# Patient Record
Sex: Female | Born: 1965 | Race: White | Hispanic: No | Smoking: Current every day smoker
Health system: Southern US, Community
[De-identification: ages and names within clinical notes are randomized; demographics above are authoritative.]

## PROBLEM LIST (undated history)

## (undated) DIAGNOSIS — C801 Malignant (primary) neoplasm, unspecified: Secondary | ICD-10-CM

## (undated) DIAGNOSIS — B1920 Unspecified viral hepatitis C without hepatic coma: Secondary | ICD-10-CM

## (undated) HISTORY — PX: APPENDECTOMY: SHX54

## (undated) HISTORY — PX: ABDOMINAL HYSTERECTOMY: SHX81

---

## 2005-02-27 ENCOUNTER — Emergency Department (HOSPITAL_COMMUNITY): Admission: EM | Admit: 2005-02-27 | Discharge: 2005-02-27 | Payer: Self-pay | Admitting: Emergency Medicine

## 2005-03-14 ENCOUNTER — Other Ambulatory Visit: Admission: RE | Admit: 2005-03-14 | Discharge: 2005-03-14 | Payer: Self-pay | Admitting: Obstetrics and Gynecology

## 2005-06-11 ENCOUNTER — Observation Stay (HOSPITAL_COMMUNITY): Admission: RE | Admit: 2005-06-11 | Discharge: 2005-06-12 | Payer: Self-pay | Admitting: Obstetrics and Gynecology

## 2015-02-06 ENCOUNTER — Ambulatory Visit (INDEPENDENT_AMBULATORY_CARE_PROVIDER_SITE_OTHER): Payer: No Typology Code available for payment source

## 2015-02-06 ENCOUNTER — Ambulatory Visit (INDEPENDENT_AMBULATORY_CARE_PROVIDER_SITE_OTHER): Payer: No Typology Code available for payment source | Admitting: Emergency Medicine

## 2015-02-06 VITALS — BP 124/72 | HR 84 | Temp 98.0°F | Resp 18 | Ht 64.0 in | Wt 187.0 lb

## 2015-02-06 DIAGNOSIS — S6710XA Crushing injury of unspecified finger(s), initial encounter: Secondary | ICD-10-CM

## 2015-02-06 DIAGNOSIS — S63619A Unspecified sprain of unspecified finger, initial encounter: Secondary | ICD-10-CM | POA: Diagnosis not present

## 2015-02-06 NOTE — Progress Notes (Addendum)
Subjective:  Patient ID: Cheryl Guzman, female    DOB: 1965-12-18  Age: 49 y.o. MRN: [id number]  CC: Hand Pain   HPI Cheryl Guzman presents  with an injury to right index finger. She said she was moving furniture in [place] fell on her finger and it trapped between the patient's body and the furniture one week ago. She has pain in the metacarpophalangeal joint and some swelling. She has no deformity. She does a lot of typing at work and is concerned about the injury. She has no improvement with over-the-counter medication.  History [name] has no past medical history on file.   She has past surgical history that includes Abdominal hysterectomy.   Her  family history includes Cancer in her mother; Diabetes in her father and mother; Heart disease in her mother; Hyperlipidemia in her mother; Stroke in her father and mother.  She   reports that she has been smoking.  She does not have any smokeless tobacco history on file. Her alcohol and drug histories are not on file.  No outpatient prescriptions prior to visit.   No facility-administered medications prior to visit.    History   Social History  . Marital Status: Divorced    Spouse Name: [name]  . Number of Children: [name]  . Years of Education: [name]   Social History Main Topics  . Smoking status: Current Every Day Smoker  . Smokeless tobacco: Not on file  . Alcohol Use: Not on file  . Drug Use: Not on file  . Sexual Activity: Not on file   Other Topics Concern  . None   Social History Narrative  . None     Review of Systems  Constitutional: Negative for fever, chills and appetite change.  HENT: Negative for congestion, ear pain, postnasal drip, sinus pressure and sore throat.   Eyes: Negative for pain and redness.  Respiratory: Negative for cough, shortness of breath and wheezing.   Cardiovascular: Negative for leg swelling.  Gastrointestinal: Negative for nausea, vomiting, abdominal pain, diarrhea,  constipation and blood in stool.  Endocrine: Negative for polyuria.  Genitourinary: Negative for dysuria, urgency, frequency and flank pain.  Musculoskeletal: Negative for gait problem.  Skin: Negative for rash.  Neurological: Negative for weakness and headaches.  Psychiatric/Behavioral: Negative for confusion and decreased concentration. The patient is not nervous/anxious.     Objective:  BP 124/72 mmHg  Pulse 84  Temp(Src) 98 F (36.7 C)  Resp 18  Ht 5\' 4"  (1.626 m)  Wt 187 lb (84.823 kg)  BMI 32.08 kg/m2  SpO2 97%  Physical Exam  Constitutional: She is oriented to person, place, and time. She appears well-developed and well-nourished. No distress.  HENT:  Head: Normocephalic and atraumatic.  Right Ear: External ear normal.  Left Ear: External ear normal.  Nose: Nose normal.  Eyes: Conjunctivae and EOM are normal. Pupils are equal, round, and reactive to light. No scleral icterus.  Neck: Normal range of motion. Neck supple. No tracheal deviation present.  Cardiovascular: Normal rate, regular rhythm and normal heart sounds.   Pulmonary/Chest: Effort normal. No respiratory distress. She has no wheezes. She has no rales.  Abdominal: She exhibits no mass. There is no tenderness. There is no rebound and no guarding.  Musculoskeletal: She exhibits no edema.  Lymphadenopathy:    She has no cervical adenopathy.  Neurological: She is alert and oriented to person, place, and time. Coordination normal.  Skin: Skin is warm and dry. No rash noted.  Psychiatric: She has  a normal mood and affect. Her behavior is normal.      Assessment & Plan:   Andelyn was seen today for hand pain.  Diagnoses and all orders for this visit:  Sprain, finger, initial encounter  Crushed finger, initial encounter Orders: -     DG Finger Index Right; Future   Her finger was buddy taped to the third finger and she'll follow-up as needed taking over-the-counter Aleve.  [name] does not  currently have medications on file.  No orders of the defined types were placed in this encounter.    Appropriate red flag conditions were discussed with the patient as well as actions that should be taken.  Patient expressed his understanding.  Follow-up: Return if symptoms worsen or fail to improve.  [name], MD    [facility] reading (PRIMARY) by  Dr. Ouida Sills. negative.

## 2015-02-06 NOTE — Patient Instructions (Signed)
Finger Sprain  A finger sprain is a tear in one of the strong, fibrous tissues that connect the bones (ligaments) in your finger. The severity of the sprain depends on how much of the ligament is torn. The tear can be either partial or complete.  CAUSES   Often, sprains are a result of a fall or accident. If you extend your hands to catch an object or to protect yourself, the force of the impact causes the fibers of your ligament to stretch too much. This excess tension causes the fibers of your ligament to tear.  SYMPTOMS   You may have some loss of motion in your finger. Other symptoms include:   Bruising.   Tenderness.   Swelling.  DIAGNOSIS   In order to diagnose finger sprain, your caregiver will physically examine your finger or thumb to determine how torn the ligament is. Your caregiver may also suggest an X-ray exam of your finger to make sure no bones are broken.  TREATMENT   If your ligament is only partially torn, treatment usually involves keeping the finger in a fixed position (immobilization) for a short period. To do this, your caregiver will apply a bandage, cast, or splint to keep your finger from moving until it heals. For a partially torn ligament, the healing process usually takes 2 to 3 weeks.  If your ligament is completely torn, you may need surgery to reconnect the ligament to the bone. After surgery a cast or splint will be applied and will need to stay on your finger or thumb for 4 to 6 weeks while your ligament heals.  HOME CARE INSTRUCTIONS   Keep your injured finger elevated, when possible, to decrease swelling.   To ease pain and swelling, apply ice to your joint twice a day, for 2 to 3 days:   Put ice in a plastic bag.   Place a towel between your skin and the bag.   Leave the ice on for 15 minutes.   Only take over-the-counter or prescription medicine for pain as directed by your caregiver.   Do not wear rings on your injured finger.   Do not leave your finger unprotected  until pain and stiffness go away (usually 3 to 4 weeks).   Do not allow your cast or splint to get wet. Cover your cast or splint with a plastic bag when you shower or bathe. Do not swim.   Your caregiver may suggest special exercises for you to do during your recovery to prevent or limit permanent stiffness.  SEEK IMMEDIATE MEDICAL CARE IF:   Your cast or splint becomes damaged.   Your pain becomes worse rather than better.  MAKE SURE YOU:   Understand these instructions.   Will watch your condition.   Will get help right away if you are not doing well or get worse.  Document Released: 09/05/2004 Document Revised: 10/21/2011 Document Reviewed: 04/01/2011  [employer] Patient Information 2015 [employer]. This information is not intended to replace advice given to you by your health care provider. Make sure you discuss any questions you have with your health care provider.

## 2016-03-10 ENCOUNTER — Encounter (HOSPITAL_COMMUNITY): Payer: Self-pay

## 2016-03-10 ENCOUNTER — Emergency Department (HOSPITAL_COMMUNITY): Payer: Self-pay

## 2016-03-10 ENCOUNTER — Inpatient Hospital Stay (HOSPITAL_COMMUNITY)
Admission: EM | Admit: 2016-03-10 | Discharge: 2016-03-13 | DRG: 419 | Disposition: A | Discharge status: Home / Self Care | Payer: Self-pay | Attending: General Surgery | Admitting: General Surgery

## 2016-03-10 DIAGNOSIS — D72829 Elevated white blood cell count, unspecified: Secondary | ICD-10-CM

## 2016-03-10 DIAGNOSIS — Z833 Family history of diabetes mellitus: Secondary | ICD-10-CM

## 2016-03-10 DIAGNOSIS — K810 Acute cholecystitis: Principal | ICD-10-CM | POA: Diagnosis present

## 2016-03-10 DIAGNOSIS — B1920 Unspecified viral hepatitis C without hepatic coma: Secondary | ICD-10-CM | POA: Diagnosis present

## 2016-03-10 DIAGNOSIS — K37 Unspecified appendicitis: Secondary | ICD-10-CM | POA: Diagnosis present

## 2016-03-10 DIAGNOSIS — R112 Nausea with vomiting, unspecified: Secondary | ICD-10-CM

## 2016-03-10 DIAGNOSIS — Z823 Family history of stroke: Secondary | ICD-10-CM

## 2016-03-10 DIAGNOSIS — Z809 Family history of malignant neoplasm, unspecified: Secondary | ICD-10-CM

## 2016-03-10 DIAGNOSIS — K5900 Constipation, unspecified: Secondary | ICD-10-CM | POA: Diagnosis present

## 2016-03-10 DIAGNOSIS — Z8249 Family history of ischemic heart disease and other diseases of the circulatory system: Secondary | ICD-10-CM

## 2016-03-10 DIAGNOSIS — R1011 Right upper quadrant pain: Secondary | ICD-10-CM

## 2016-03-10 DIAGNOSIS — F17200 Nicotine dependence, unspecified, uncomplicated: Secondary | ICD-10-CM | POA: Diagnosis present

## 2016-03-10 HISTORY — DX: Unspecified viral hepatitis C without hepatic coma: B19.20

## 2016-03-10 LAB — URINALYSIS, ROUTINE W REFLEX MICROSCOPIC
GLUCOSE, UA: NEGATIVE mg/dL
Hgb urine dipstick: NEGATIVE
Ketones, ur: 40 mg/dL — AB
LEUKOCYTES UA: NEGATIVE
Nitrite: NEGATIVE
PH: 5.5 (ref 5.0–8.0)
PROTEIN: 100 mg/dL — AB

## 2016-03-10 LAB — DIFFERENTIAL
BASOS PCT: 0 %
Basophils Absolute: 0 10*3/uL (ref 0.0–0.1)
EOS ABS: 0 10*3/uL (ref 0.0–0.7)
Eosinophils Relative: 0 %
Lymphocytes Relative: 18 %
Lymphs Abs: 2.4 10*3/uL (ref 0.7–4.0)
MONO ABS: 0.7 10*3/uL (ref 0.1–1.0)
Monocytes Relative: 5 %
NEUTROS ABS: 10 10*3/uL — AB (ref 1.7–7.7)
Neutrophils Relative %: 77 %

## 2016-03-10 LAB — CBC
HEMATOCRIT: 42.8 % (ref 36.0–46.0)
HEMOGLOBIN: 13.7 g/dL (ref 12.0–15.0)
MCH: 28.8 pg (ref 26.0–34.0)
MCHC: 32 g/dL (ref 30.0–36.0)
MCV: 90.1 fL (ref 78.0–100.0)
PLATELETS: 323 10*3/uL (ref 150–400)
RBC: 4.75 MIL/uL (ref 3.87–5.11)
RDW: 13.1 % (ref 11.5–15.5)
WBC: 12.8 10*3/uL — AB (ref 4.0–10.5)

## 2016-03-10 LAB — COMPREHENSIVE METABOLIC PANEL
ALT: 22 U/L (ref 14–54)
ANION GAP: 10 (ref 5–15)
AST: 18 U/L (ref 15–41)
Albumin: 3.9 g/dL (ref 3.5–5.0)
Alkaline Phosphatase: 111 U/L (ref 38–126)
BUN: 13 mg/dL (ref 6–20)
CHLORIDE: 105 mmol/L (ref 101–111)
CO2: 21 mmol/L — AB (ref 22–32)
Calcium: 9.7 mg/dL (ref 8.9–10.3)
Creatinine, Ser: 0.7 mg/dL (ref 0.44–1.00)
Glucose, Bld: 134 mg/dL — ABNORMAL HIGH (ref 65–99)
POTASSIUM: 3.7 mmol/L (ref 3.5–5.1)
SODIUM: 136 mmol/L (ref 135–145)
Total Bilirubin: 0.5 mg/dL (ref 0.3–1.2)
Total Protein: 7.9 g/dL (ref 6.5–8.1)

## 2016-03-10 LAB — URINE MICROSCOPIC-ADD ON

## 2016-03-10 LAB — LIPASE, BLOOD: LIPASE: 18 U/L (ref 11–51)

## 2016-03-10 MED ORDER — PNEUMOCOCCAL VAC POLYVALENT 25 MCG/0.5ML IJ INJ
0.5000 mL | INJECTION | INTRAMUSCULAR | Status: AC
Start: 2016-03-11 — End: 2016-03-12
  Administered 2016-03-12: 0.5 mL via INTRAMUSCULAR
  Filled 2016-03-10: qty 0.5

## 2016-03-10 MED ORDER — HYDROMORPHONE HCL 1 MG/ML IJ SOLN
1.0000 mg | Freq: Once | INTRAMUSCULAR | Status: AC
  Administered 2016-03-10: 1 mg via INTRAVENOUS
  Filled 2016-03-10: qty 1

## 2016-03-10 MED ORDER — METHOCARBAMOL 500 MG PO TABS
500.0000 mg | ORAL_TABLET | Freq: Four times a day (QID) | ORAL | Status: DC | PRN
  Administered 2016-03-11 – 2016-03-13 (×4): 500 mg via ORAL
  Filled 2016-03-10 (×5): qty 1

## 2016-03-10 MED ORDER — KCL IN DEXTROSE-NACL 20-5-0.45 MEQ/L-%-% IV SOLN
INTRAVENOUS | Status: DC
  Administered 2016-03-10 – 2016-03-12 (×6): via INTRAVENOUS
  Filled 2016-03-10 (×5): qty 1000

## 2016-03-10 MED ORDER — DEXTROSE-NACL 5-0.45 % IV SOLN: INTRAVENOUS | Status: DC

## 2016-03-10 MED ORDER — ONDANSETRON HCL 4 MG/2ML IJ SOLN
4.0000 mg | Freq: Once | INTRAMUSCULAR | Status: AC
  Administered 2016-03-10: 4 mg via INTRAVENOUS
  Filled 2016-03-10: qty 2

## 2016-03-10 MED ORDER — ONDANSETRON 4 MG PO TBDP: 4.0000 mg | ORAL_TABLET | Freq: Four times a day (QID) | ORAL | Status: DC | PRN

## 2016-03-10 MED ORDER — DIPHENHYDRAMINE HCL 12.5 MG/5ML PO ELIX: 12.5000 mg | ORAL_SOLUTION | Freq: Four times a day (QID) | ORAL | Status: DC | PRN

## 2016-03-10 MED ORDER — SODIUM CHLORIDE 0.9 % IV BOLUS (SEPSIS)
1000.0000 mL | Freq: Once | INTRAVENOUS | Status: AC
  Administered 2016-03-10: 1000 mL via INTRAVENOUS

## 2016-03-10 MED ORDER — SIMETHICONE 80 MG PO CHEW
40.0000 mg | CHEWABLE_TABLET | Freq: Four times a day (QID) | ORAL | Status: DC | PRN
  Administered 2016-03-12: 40 mg via ORAL
  Filled 2016-03-10: qty 1

## 2016-03-10 MED ORDER — DEXTROSE 5 % IV SOLN
2.0000 g | INTRAVENOUS | Status: DC
  Administered 2016-03-10: 2 g via INTRAVENOUS
  Filled 2016-03-10 (×2): qty 2

## 2016-03-10 MED ORDER — OXYCODONE HCL 5 MG PO TABS
5.0000 mg | ORAL_TABLET | ORAL | Status: DC | PRN
  Administered 2016-03-11 – 2016-03-13 (×5): 10 mg via ORAL
  Filled 2016-03-10 (×5): qty 2

## 2016-03-10 MED ORDER — ENOXAPARIN SODIUM 40 MG/0.4ML ~~LOC~~ SOLN
40.0000 mg | SUBCUTANEOUS | Status: DC
  Administered 2016-03-10 – 2016-03-12 (×3): 40 mg via SUBCUTANEOUS
  Filled 2016-03-10 (×3): qty 0.4

## 2016-03-10 MED ORDER — ZOLPIDEM TARTRATE 5 MG PO TABS: 5.0000 mg | ORAL_TABLET | Freq: Every evening | ORAL | Status: DC | PRN

## 2016-03-10 MED ORDER — SENNA 8.6 MG PO TABS
1.0000 | ORAL_TABLET | Freq: Two times a day (BID) | ORAL | Status: DC
  Administered 2016-03-10 – 2016-03-12 (×4): 8.6 mg via ORAL
  Filled 2016-03-10 (×4): qty 1

## 2016-03-10 MED ORDER — AMPICILLIN-SULBACTAM SODIUM 3 (2-1) G IJ SOLR
3.0000 g | Freq: Once | INTRAMUSCULAR | Status: AC
  Administered 2016-03-10: 3 g via INTRAVENOUS
  Filled 2016-03-10 (×2): qty 3

## 2016-03-10 MED ORDER — DIPHENHYDRAMINE HCL 50 MG/ML IJ SOLN: 12.5000 mg | Freq: Four times a day (QID) | INTRAMUSCULAR | Status: DC | PRN

## 2016-03-10 MED ORDER — HYDROMORPHONE HCL 1 MG/ML IJ SOLN
1.0000 mg | INTRAMUSCULAR | Status: DC | PRN
  Administered 2016-03-10 – 2016-03-11 (×7): 1 mg via INTRAVENOUS
  Filled 2016-03-10 (×7): qty 1

## 2016-03-10 MED ORDER — MORPHINE SULFATE (PF) 4 MG/ML IV SOLN
4.0000 mg | Freq: Once | INTRAVENOUS | Status: AC
  Administered 2016-03-10: 4 mg via INTRAVENOUS
  Filled 2016-03-10: qty 1

## 2016-03-10 MED ORDER — ONDANSETRON HCL 4 MG/2ML IJ SOLN
4.0000 mg | Freq: Four times a day (QID) | INTRAMUSCULAR | Status: DC | PRN
  Administered 2016-03-11 (×2): 4 mg via INTRAVENOUS
  Filled 2016-03-10 (×2): qty 2

## 2016-03-10 MED ORDER — ACETAMINOPHEN 500 MG PO TABS: 1000.0000 mg | ORAL_TABLET | Freq: Four times a day (QID) | ORAL | Status: DC | PRN

## 2016-03-10 MED ORDER — BISACODYL 5 MG PO TBEC: 5.0000 mg | DELAYED_RELEASE_TABLET | Freq: Every day | ORAL | Status: DC | PRN

## 2016-03-10 MED ORDER — POTASSIUM CHLORIDE 2 MEQ/ML IV SOLN: INTRAVENOUS | Status: DC

## 2016-03-10 NOTE — ED Provider Notes (Signed)
[place] DEPT Provider Note   CSN: [id number] Arrival date & time: 03/10/16  0827  First Provider Contact:  None       History   Chief Complaint Chief Complaint  Patient presents with  . Abdominal Pain    HPI Cheryl Guzman is a 50 y.o. female with a PMHx of HepC, and a PSHx of abd hysterectomy, who presents to the ED with complaints of right upper abdominal pain that was intermittent 2 weeks but has become more constant over the last 2 days. She describes the pain is 8/10 constant crampy right upper quadrant pain radiating into the right flank, worse with movement and deep breathing, and unrelieved with Advil, Tylenol, and Sprite. She has not been able to eat anything since Friday night due to the pain. Associated symptoms include nausea, 5 episodes of bilious nonbloody emesis in the last 24 hours, belching, constipation with no bowel movement since Thursday which she relates to the fact that she has not eaten anything since Friday, increased urinary frequency, and malodorous urine. Positive family history of gallbladder issues in her mother. Denies history of kidney stones. History of abdominal hysterectomy, no other abdominal surgeries.  She denies any fevers, chills, chest pain, shortness breath, hematemesis, melena, hematochezia, diarrhea, obstipation, dysuria, hematuria, vaginal bleeding or discharge, numbness, tingling, weakness, recent travel, sick contacts, suspicious food intake, alcohol use, or chronic NSAID use.   The history is provided by the patient and medical records. No language interpreter was used.  Abdominal Pain   This is a new problem. The current episode started more than 1 week ago. The problem occurs constantly. The problem has been gradually worsening. The pain is associated with an unknown factor. The pain is located in the RUQ. The pain is at a severity of 8/10. The pain is moderate. Associated symptoms include belching, nausea, vomiting, constipation  and frequency. Pertinent negatives include fever, diarrhea, flatus, hematochezia, melena, dysuria, hematuria, arthralgias and myalgias. The symptoms are aggravated by activity and deep breathing. Nothing relieves the symptoms.    Past Medical History:  Diagnosis Date  . Hepatitis C     There are no active problems to display for this patient.   Past Surgical History:  Procedure Laterality Date  . ABDOMINAL HYSTERECTOMY      OB History    No data available       Home Medications    Prior to Admission medications   Not on File    Family History Family History  Problem Relation Age of Onset  . Cancer Mother   . Diabetes Mother   . Heart disease Mother   . Hyperlipidemia Mother   . Stroke Mother   . Diabetes Father   . Stroke Father     Social History Social History  Substance Use Topics  . Smoking status: Current Every Day Smoker  . Smokeless tobacco: Never Used  . Alcohol use Not on file     Allergies   Review of patient's allergies indicates no known allergies.   Review of Systems Review of Systems  Constitutional: Negative for chills and fever.  Respiratory: Negative for shortness of breath.   Cardiovascular: Negative for chest pain.  Gastrointestinal: Positive for abdominal pain, constipation, nausea and vomiting. Negative for anal bleeding, blood in stool, diarrhea, flatus, hematochezia and melena.       +belching  Genitourinary: Positive for flank pain and frequency. Negative for dysuria, hematuria, vaginal bleeding and vaginal discharge.       +malodorous urine  Musculoskeletal: Negative for arthralgias and myalgias.  Skin: Negative for color change.  Allergic/Immunologic: Negative for immunocompromised state.  Neurological: Negative for weakness and numbness.  Psychiatric/Behavioral: Negative for confusion.   10 Systems reviewed and are negative for acute change except as noted in the HPI.   Physical Exam Updated Vital Signs BP 133/96 (BP  Location: Right Arm)   Pulse 70   Temp 97.6 F (36.4 C) (Oral)   Resp 20   Ht 5\' 4"  (1.626 m)   Wt 81.6 kg   SpO2 100%   BMI 30.90 kg/m   Physical Exam  Constitutional: She is oriented to person, place, and time. Vital signs are normal. She appears well-developed and well-nourished.  Non-toxic appearance. No distress.  Afebrile, nontoxic, NAD although appears uncomfortable  HENT:  Head: Normocephalic and atraumatic.  Mouth/Throat: Oropharynx is clear and moist and mucous membranes are normal.  Eyes: Conjunctivae and EOM are normal. Right eye exhibits no discharge. Left eye exhibits no discharge.  Neck: Normal range of motion. Neck supple.  Cardiovascular: Normal rate, regular rhythm, normal heart sounds and intact distal pulses.  Exam reveals no gallop and no friction rub.   No murmur heard. Pulmonary/Chest: Effort normal and breath sounds normal. No respiratory distress. She has no decreased breath sounds. She has no wheezes. She has no rhonchi. She has no rales.  Abdominal: Soft. Normal appearance and bowel sounds are normal. She exhibits no distension. There is tenderness in the right upper quadrant and right lower quadrant. There is guarding (voluntary), CVA tenderness and positive Murphy's sign. There is no rigidity, no rebound and no tenderness at McBurney's point.  Soft, nondistended, +BS throughout, with R upper and lower quadrant TTP but most focally in the RUQ, +voluntary guarding, no rebound/rigidity, +murphy's, neg mcburney's, with mild R sided CVA TTP   Musculoskeletal: Normal range of motion.  Neurological: She is alert and oriented to person, place, and time. She has normal strength. No sensory deficit.  Skin: Skin is warm, dry and intact. No rash noted.  Psychiatric: She has a normal mood and affect.  Nursing note and vitals reviewed.    ED Treatments / Results  Labs (all labs ordered are listed, but only abnormal results are displayed) Labs Reviewed  COMPREHENSIVE  METABOLIC PANEL - Abnormal; Notable for the following:       Result Value   CO2 21 (*)    Glucose, Bld 134 (*)    All other components within normal limits  CBC - Abnormal; Notable for the following:    WBC 12.8 (*)    All other components within normal limits  URINALYSIS, ROUTINE W REFLEX MICROSCOPIC (NOT AT [facility]) - Abnormal; Notable for the following:    Color, Urine ORANGE (*)    APPearance CLOUDY (*)    Specific Gravity, Urine >1.046 (*)    Bilirubin Urine MODERATE (*)    Ketones, ur 40 (*)    Protein, ur 100 (*)    All other components within normal limits  DIFFERENTIAL - Abnormal; Notable for the following:    Neutro Abs 10.0 (*)    All other components within normal limits  URINE MICROSCOPIC-ADD ON - Abnormal; Notable for the following:    Squamous Epithelial / LPF 6-30 (*)    Bacteria, UA RARE (*)    All other components within normal limits  LIPASE, BLOOD    EKG  EKG Interpretation None       Radiology US Abdomen Complete  Result Date: 03/10/2016 CLINICAL DATA:  Right upper quadrant pain, right flank pain. Positive Murphy's sign. EXAM: ABDOMEN ULTRASOUND COMPLETE COMPARISON:  CT 02/27/2005 FINDINGS: Gallbladder: No visible gallstones. There is sludge within the gallbladder. There is gallbladder wall thickening, measuring 6 mm. The patient was tender over the gallbladder during the study. Common bile duct: Diameter: Normal caliber, 3 mm Liver: No focal lesion identified. Within normal limits in parenchymal echogenicity. IVC: No abnormality visualized. Pancreas: Visualized portion unremarkable. Spleen: Size and appearance within normal limits. Right Kidney: Length: 11.2 cm. Echogenicity within normal limits. No mass or hydronephrosis visualized. Left Kidney: Length: 11.6 cm. Echogenicity within normal limits. No mass or hydronephrosis visualized. Abdominal aorta: No aneurysm visualized. Other findings: None. IMPRESSION: No visible stones within the gallbladder. There is  layering sludge, wall thickening and tenderness over the gallbladder during the study. Findings concerning for acute acalculous cholecystitis. This could be further evaluated with nuclear medicine hepatobiliary scan if felt indicated. Electronically Signed   By: Rolm Baptise M.D.   On: 03/10/2016 10:14   Procedures Procedures (including critical care time)  Medications Ordered in ED Medications  Ampicillin-Sulbactam (UNASYN) 3 g in sodium chloride 0.9 % 100 mL IVPB (not administered)  sodium chloride 0.9 % bolus 1,000 mL (0 mLs Intravenous Stopped 03/10/16 1050)  morphine 4 MG/ML injection 4 mg (4 mg Intravenous Given 03/10/16 0922)  ondansetron (ZOFRAN) injection 4 mg (4 mg Intravenous Given 03/10/16 0922)  HYDROmorphone (DILAUDID) injection 1 mg (1 mg Intravenous Given 03/10/16 1049)     Initial Impression / Assessment and Plan / ED Course  I have reviewed the triage vital signs and the nursing notes.  Pertinent labs & imaging results that were available during my care of the patient were reviewed by me and considered in my medical decision making (see chart for details).  Clinical Course    50 y.o. female here with right upper quadrant abdominal pain intermittent 2 weeks and more constant 2 days. Unable to eat since Friday 2 days ago due to the pain. On exam, right lateral abdominal tenderness in the upper and lower quadrants, most focally in the upper quadrant, positive Murphy's sign, some voluntary guarding although no rebound or rigidity. She has had some urinary symptoms and some constipation, although she thinks the constipation is because she has not eaten. Positive family history of gallbladder issues. Most likely etiology is gallbladder, although nephrolithiasis versus constipation related pain could be at play, but will proceed with lab work, UA, and abdominal ultrasound. If abdominal ultrasound does not reveal etiology, may need CT to r/o other causes. Will give pain meds and  nausea meds, and reassess shortly.   10:21 AM U/A not yet done. Lipase WNL. CBC with leukocytosis 12.8, differential pending. CMP WNL. U/S showing no gallstones but sludge and +murphy's and gallbladder wall thickening concerning for acalculous acute cholecystitis, which would be consistent with her symptoms. Will likely need to have this taken out today. Discussed case with my attending Dr. Wyvonnia Dusky who saw pt and agrees with plan. Will consult surgery.   10:34 AM Dr. Barry Dienes down to see patients, discussed case with her, would like HIDA scan ordered now, if we can get it from the ER then we can skip getting medical admission and figure out if this is purely surgical/gallbladder related. Will attempt to get HIDA scan now. Pt still complaining of pain, morphine says it wasn't given but pt states she did get it, will order dilaudid now. Will keep NPO and reassess shortly.   10:59 AM Eddie from  [facility] states that because pt got narcotics in the last 6hrs, she can't have the HIDA scan because it interferes with the gallbladder's ability to take up the nuc med contrast material, would need to have no narcotics for 6hrs for scan to be done. Will discuss with Dr. Barry Dienes to see what she would like for [place] to do with this.   11:35 AM Differential shows neutrophilic predominance. Nursing staff from [facility] calling, told Dr. Barry Dienes about issues with the HIDA scan, stated they will call me back shortly. Will await return call.   12:11 PM U/A without evidence of UTI, appears contaminated. Still haven't heard back from Dr. Barry Dienes. Will have her re-paged again.   12:20 PM Dr. Barry Dienes returning page, would like to admit to her service, start unasyn, keep NPO, and she will take over care from here. Please see her notes for further documentation of care. Pt stable at this time.  BP 133/96 (BP Location: Right Arm)   Pulse 70   Temp 97.6 F (36.4 C) (Oral)   Resp 20   Ht 5\' 4"  (1.626 m)   Wt 81.6 kg   SpO2 100%   BMI  30.90 kg/m    Final Clinical Impressions(s) / ED Diagnoses   Final diagnoses:  RUQ abdominal pain  Nausea and vomiting in adult patient  Acute acalculous cholecystitis  Leukocytosis    New Prescriptions New Prescriptions   No medications on file     Homeworth Camprubi-Soms, PA-C 03/10/16 1221    Ezequiel Essex, MD 03/10/16 1712

## 2016-03-10 NOTE — Progress Notes (Signed)
Pharmacy: [facility] for intra-abd infection. WBC 12.8, creat 0.7, AF  Plan: Unasyn 3 gm IV q8h Pharmacy to sign off Thanks Eudelia Bunch, Pharm.D. [phone] 03/10/2016 12:35 PM

## 2016-03-10 NOTE — ED Triage Notes (Signed)
Patient here with right sided abdominal cramping/pain x 2 weeks that has worsened . Nausea and intermittent vomiting with same, states that it radiates to flank. Guarding on arival

## 2016-03-10 NOTE — H&P (Signed)
Cheryl Guzman is an 50 y.o. female.   Chief Complaint: abdominal pain HPI:  Pt is a 50 yo F who presents with 2 weeks of intermittent upper abdominal pain that worsened and became constant over the last 2 days.  The patient has also had nausea/vomiting.  She has been unable to eat for 2 days.  She denies fever/chills/diarrhea, but has had constipation.  She denies jaundice.  She complains of dark malodorous urine.  The pain is worse when she moves and when she takes a deep breath.  She tried tylenol and advil and had no relief of her symptoms.  Her mother had gallbladder disease.    Past Medical History:  Diagnosis Date  . Hepatitis C     Past Surgical History:  Procedure Laterality Date  . ABDOMINAL HYSTERECTOMY      Family History  Problem Relation Age of Onset  . Cancer Mother   . Diabetes Mother   . Heart disease Mother   . Hyperlipidemia Mother   . Stroke Mother   . Diabetes Father   . Stroke Father    Social History:  reports that she has been smoking.  She has never used smokeless tobacco. Her alcohol and drug histories are not on file.  Allergies: No Known Allergies  Medications Prior to Admission  Medication Sig Dispense Refill  . acetaminophen (TYLENOL) 500 MG tablet Take 1,000 mg by mouth every 6 (six) hours as needed for moderate pain.    . Omega-3 1000 MG CAPS Take 1,000 mg by mouth at bedtime.      Results for orders placed or performed during the hospital encounter of 03/10/16 (from the past 48 hour(s))  Lipase, blood     Status: None   Collection Time: 03/10/16  8:46 AM  Result Value Ref Range   Lipase 18 11 - 51 U/L  Comprehensive metabolic panel     Status: Abnormal   Collection Time: 03/10/16  8:46 AM  Result Value Ref Range   Sodium 136 135 - 145 mmol/L   Potassium 3.7 3.5 - 5.1 mmol/L   Chloride 105 101 - 111 mmol/L   CO2 21 (L) 22 - 32 mmol/L   Glucose, Bld 134 (H) 65 - 99 mg/dL   BUN 13 6 - 20 mg/dL   Creatinine, Ser 0.70 0.44 - 1.00 mg/dL    Calcium 9.7 8.9 - 10.3 mg/dL   Total Protein 7.9 6.5 - 8.1 g/dL   Albumin 3.9 3.5 - 5.0 g/dL   AST 18 15 - 41 U/L   ALT 22 14 - 54 U/L   Alkaline Phosphatase 111 38 - 126 U/L   Total Bilirubin 0.5 0.3 - 1.2 mg/dL   GFR calc non Af Amer >60 >60 mL/min   GFR calc Af Amer >60 >60 mL/min    Comment: (NOTE) The eGFR has been calculated using the CKD EPI equation. This calculation has not been validated in all clinical situations. eGFR's persistently <60 mL/min signify possible Chronic Kidney Disease.    Anion gap 10 5 - 15  CBC     Status: Abnormal   Collection Time: 03/10/16  8:46 AM  Result Value Ref Range   WBC 12.8 (H) 4.0 - 10.5 K/uL   RBC 4.75 3.87 - 5.11 MIL/uL   Hemoglobin 13.7 12.0 - 15.0 g/dL   HCT 42.8 36.0 - 46.0 %   MCV 90.1 78.0 - 100.0 fL   MCH 28.8 26.0 - 34.0 pg   MCHC 32.0 30.0 - 36.0  g/dL   RDW 13.1 11.5 - 15.5 %   Platelets 323 150 - 400 K/uL  Differential     Status: Abnormal   Collection Time: 03/10/16  8:46 AM  Result Value Ref Range   Neutrophils Relative % 77 %   Neutro Abs 10.0 (H) 1.7 - 7.7 K/uL   Lymphocytes Relative 18 %   Lymphs Abs 2.4 0.7 - 4.0 K/uL   Monocytes Relative 5 %   Monocytes Absolute 0.7 0.1 - 1.0 K/uL   Eosinophils Relative 0 %   Eosinophils Absolute 0.0 0.0 - 0.7 K/uL   Basophils Relative 0 %   Basophils Absolute 0.0 0.0 - 0.1 K/uL  Urinalysis, Routine w reflex microscopic     Status: Abnormal   Collection Time: 03/10/16  9:00 AM  Result Value Ref Range   Color, Urine ORANGE (A) YELLOW    Comment: BIOCHEMICALS MAY BE AFFECTED BY COLOR   APPearance CLOUDY (A) CLEAR   Specific Gravity, Urine >1.046 (H) 1.005 - 1.030   pH 5.5 5.0 - 8.0   Glucose, UA NEGATIVE NEGATIVE mg/dL   Hgb urine dipstick NEGATIVE NEGATIVE   Bilirubin Urine MODERATE (A) NEGATIVE   Ketones, ur 40 (A) NEGATIVE mg/dL   Protein, ur 100 (A) NEGATIVE mg/dL   Nitrite NEGATIVE NEGATIVE   Leukocytes, UA NEGATIVE NEGATIVE  Urine microscopic-add on     Status:  Abnormal   Collection Time: 03/10/16  9:00 AM  Result Value Ref Range   Squamous Epithelial / LPF 6-30 (A) NONE SEEN   WBC, UA 0-5 0 - 5 WBC/hpf   RBC / HPF 0-5 0 - 5 RBC/hpf   Bacteria, UA RARE (A) NONE SEEN   Urine-Other MUCOUS PRESENT    US Abdomen Complete  Result Date: 03/10/2016 CLINICAL DATA:  Right upper quadrant pain, right flank pain. Positive Murphy's sign. EXAM: ABDOMEN ULTRASOUND COMPLETE COMPARISON:  CT 02/27/2005 FINDINGS: Gallbladder: No visible gallstones. There is sludge within the gallbladder. There is gallbladder wall thickening, measuring 6 mm. The patient was tender over the gallbladder during the study. Common bile duct: Diameter: Normal caliber, 3 mm Liver: No focal lesion identified. Within normal limits in parenchymal echogenicity. IVC: No abnormality visualized. Pancreas: Visualized portion unremarkable. Spleen: Size and appearance within normal limits. Right Kidney: Length: 11.2 cm. Echogenicity within normal limits. No mass or hydronephrosis visualized. Left Kidney: Length: 11.6 cm. Echogenicity within normal limits. No mass or hydronephrosis visualized. Abdominal aorta: No aneurysm visualized. Other findings: None. IMPRESSION: No visible stones within the gallbladder. There is layering sludge, wall thickening and tenderness over the gallbladder during the study. Findings concerning for acute acalculous cholecystitis. This could be further evaluated with nuclear medicine hepatobiliary scan if felt indicated. Electronically Signed   By: Rolm Baptise M.D.   On: 03/10/2016 10:14   Review of Systems  Constitutional: Negative.   HENT: Negative.   Eyes: Negative.   Respiratory: Negative.   Cardiovascular: Negative.   Gastrointestinal: Positive for abdominal pain, constipation, nausea and vomiting.  Genitourinary: Negative.        Dark urine  Musculoskeletal: Negative.   Skin: Negative.   Neurological: Negative.   Endo/Heme/Allergies: Negative.    Psychiatric/Behavioral: Negative.     Blood pressure 116/78, pulse 73, temperature 98 F (36.7 C), temperature source Oral, resp. rate 18, height 5' 4"  (1.626 m), weight 81.6 kg (180 lb), SpO2 100 %. Physical Exam  Constitutional: She is oriented to person, place, and time. She appears well-developed and well-nourished. She appears distressed (looks uncomfortable ).  HENT:  Head: Normocephalic and atraumatic.  Right Ear: External ear normal.  Left Ear: External ear normal.  Eyes: Conjunctivae are normal. Pupils are equal, round, and reactive to light. Right eye exhibits no discharge. Left eye exhibits no discharge. No scleral icterus.  Neck: Normal range of motion. Neck supple. No tracheal deviation present. No thyromegaly present.  Cardiovascular: Normal rate, regular rhythm and intact distal pulses.   Respiratory: Effort normal. No respiratory distress. She exhibits no tenderness.  GI: Soft. She exhibits distension (mild). She exhibits no mass. There is tenderness (RUQ/epigastrium). There is no rebound and no guarding.  Musculoskeletal: Normal range of motion. She exhibits no edema or deformity.  Lymphadenopathy:    She has no cervical adenopathy.  Neurological: She is alert and oriented to person, place, and time. Coordination normal.  Skin: Skin is warm and dry. No rash noted. She is not diaphoretic. No erythema. No pallor.  Psychiatric: She has a normal mood and affect. Her behavior is normal. Judgment and thought content normal.     Assessment/Plan Acute acalculous cholecystitis IV fluids IV antibiotics NPO after midnight OR tentatively planned tomorrow for Dr. Hulen Skains.    Discussed surgery with patient.    Stark Klein, MD 03/10/2016, 3:13 PM

## 2016-03-11 ENCOUNTER — Inpatient Hospital Stay (HOSPITAL_COMMUNITY): Payer: Self-pay | Admitting: Anesthesiology

## 2016-03-11 ENCOUNTER — Encounter (HOSPITAL_COMMUNITY): Admission: EM | Disposition: A | Discharge status: Home / Self Care | Payer: Self-pay | Source: Home / Self Care

## 2016-03-11 ENCOUNTER — Encounter (HOSPITAL_COMMUNITY): Payer: Self-pay | Admitting: Anesthesiology

## 2016-03-11 HISTORY — PX: CHOLECYSTECTOMY: SHX55

## 2016-03-11 HISTORY — PX: LAPAROSCOPIC APPENDECTOMY: SHX408

## 2016-03-11 LAB — COMPREHENSIVE METABOLIC PANEL
ALT: 17 U/L (ref 14–54)
ANION GAP: 8 (ref 5–15)
AST: 14 U/L — ABNORMAL LOW (ref 15–41)
Albumin: 3.1 g/dL — ABNORMAL LOW (ref 3.5–5.0)
Alkaline Phosphatase: 89 U/L (ref 38–126)
BUN: <5 mg/dL — ABNORMAL LOW (ref 6–20)
CALCIUM: 8.8 mg/dL — AB (ref 8.9–10.3)
CHLORIDE: 104 mmol/L (ref 101–111)
CO2: 24 mmol/L (ref 22–32)
CREATININE: 0.65 mg/dL (ref 0.44–1.00)
Glucose, Bld: 113 mg/dL — ABNORMAL HIGH (ref 65–99)
Potassium: 3.6 mmol/L (ref 3.5–5.1)
Sodium: 136 mmol/L (ref 135–145)
Total Bilirubin: 0.6 mg/dL (ref 0.3–1.2)
Total Protein: 6.5 g/dL (ref 6.5–8.1)

## 2016-03-11 LAB — SURGICAL PCR SCREEN
MRSA, PCR: NEGATIVE
Staphylococcus aureus: NEGATIVE

## 2016-03-11 LAB — CBC
HCT: 37.4 % (ref 36.0–46.0)
Hemoglobin: 12 g/dL (ref 12.0–15.0)
MCH: 28.8 pg (ref 26.0–34.0)
MCHC: 32.1 g/dL (ref 30.0–36.0)
MCV: 89.7 fL (ref 78.0–100.0)
PLATELETS: 262 10*3/uL (ref 150–400)
RBC: 4.17 MIL/uL (ref 3.87–5.11)
RDW: 13.3 % (ref 11.5–15.5)
WBC: 10.1 10*3/uL (ref 4.0–10.5)

## 2016-03-11 SURGERY — LAPAROSCOPIC CHOLECYSTECTOMY WITH INTRAOPERATIVE CHOLANGIOGRAM
Anesthesia: General | Site: Abdomen

## 2016-03-11 MED ORDER — HYDROMORPHONE HCL 1 MG/ML IJ SOLN
INTRAMUSCULAR | Status: AC
  Administered 2016-03-11: 0.5 mg via INTRAVENOUS
  Filled 2016-03-11: qty 1

## 2016-03-11 MED ORDER — ONDANSETRON HCL 4 MG/2ML IJ SOLN
INTRAMUSCULAR | Status: AC
  Filled 2016-03-11: qty 2

## 2016-03-11 MED ORDER — SUCCINYLCHOLINE CHLORIDE 20 MG/ML IJ SOLN
INTRAMUSCULAR | Status: DC | PRN
  Administered 2016-03-11: 100 mg via INTRAVENOUS

## 2016-03-11 MED ORDER — BUPIVACAINE-EPINEPHRINE 0.25% -1:200000 IJ SOLN
INTRAMUSCULAR | Status: DC | PRN
  Administered 2016-03-11: 18 mL

## 2016-03-11 MED ORDER — MIDAZOLAM HCL 2 MG/2ML IJ SOLN
INTRAMUSCULAR | Status: AC
  Filled 2016-03-11: qty 2

## 2016-03-11 MED ORDER — LIDOCAINE 2% (20 MG/ML) 5 ML SYRINGE
INTRAMUSCULAR | Status: AC
  Filled 2016-03-11: qty 5

## 2016-03-11 MED ORDER — PROPOFOL 10 MG/ML IV BOLUS
INTRAVENOUS | Status: AC
  Filled 2016-03-11: qty 20

## 2016-03-11 MED ORDER — SODIUM CHLORIDE 0.9 % IV SOLN
INTRAVENOUS | Status: DC | PRN
  Administered 2016-03-11: 13:00:00

## 2016-03-11 MED ORDER — ROCURONIUM BROMIDE 100 MG/10ML IV SOLN
INTRAVENOUS | Status: DC | PRN
  Administered 2016-03-11 (×2): 25 mg via INTRAVENOUS

## 2016-03-11 MED ORDER — LIDOCAINE HCL (CARDIAC) 20 MG/ML IV SOLN
INTRAVENOUS | Status: DC | PRN
  Administered 2016-03-11: 80 mg via INTRAVENOUS

## 2016-03-11 MED ORDER — HYDROMORPHONE HCL 1 MG/ML IJ SOLN
1.0000 mg | INTRAMUSCULAR | Status: DC | PRN
  Administered 2016-03-12 (×4): 1 mg via INTRAVENOUS
  Filled 2016-03-11 (×4): qty 1

## 2016-03-11 MED ORDER — PIPERACILLIN-TAZOBACTAM 3.375 G IVPB
3.3750 g | Freq: Three times a day (TID) | INTRAVENOUS | Status: DC
  Administered 2016-03-11 – 2016-03-13 (×5): 3.375 g via INTRAVENOUS
  Filled 2016-03-11 (×7): qty 50

## 2016-03-11 MED ORDER — SUCCINYLCHOLINE CHLORIDE 200 MG/10ML IV SOSY
PREFILLED_SYRINGE | INTRAVENOUS | Status: AC
  Filled 2016-03-11: qty 20

## 2016-03-11 MED ORDER — 0.9 % SODIUM CHLORIDE (POUR BTL) OPTIME
TOPICAL | Status: DC | PRN
  Administered 2016-03-11: 1000 mL

## 2016-03-11 MED ORDER — ROCURONIUM BROMIDE 50 MG/5ML IV SOLN
INTRAVENOUS | Status: AC
  Filled 2016-03-11: qty 1

## 2016-03-11 MED ORDER — KETOROLAC TROMETHAMINE 30 MG/ML IJ SOLN
30.0000 mg | Freq: Four times a day (QID) | INTRAMUSCULAR | Status: DC | PRN
  Administered 2016-03-11 – 2016-03-13 (×5): 30 mg via INTRAVENOUS
  Filled 2016-03-11 (×5): qty 1

## 2016-03-11 MED ORDER — LACTATED RINGERS IV SOLN
INTRAVENOUS | Status: DC | PRN
  Administered 2016-03-11 (×2): via INTRAVENOUS

## 2016-03-11 MED ORDER — BUPIVACAINE-EPINEPHRINE (PF) 0.25% -1:200000 IJ SOLN
INTRAMUSCULAR | Status: AC
  Filled 2016-03-11: qty 30

## 2016-03-11 MED ORDER — ONDANSETRON HCL 4 MG/2ML IJ SOLN: 4.0000 mg | Freq: Once | INTRAMUSCULAR | Status: DC | PRN

## 2016-03-11 MED ORDER — MIDAZOLAM HCL 5 MG/5ML IJ SOLN
INTRAMUSCULAR | Status: DC | PRN
  Administered 2016-03-11 (×2): 1 mg via INTRAVENOUS

## 2016-03-11 MED ORDER — SODIUM CHLORIDE 0.9 % IR SOLN
Status: DC | PRN
  Administered 2016-03-11: 1000 mL
  Administered 2016-03-11: 3000 mL

## 2016-03-11 MED ORDER — LACTATED RINGERS IV SOLN
INTRAVENOUS | Status: DC
  Administered 2016-03-11: 13:00:00 via INTRAVENOUS

## 2016-03-11 MED ORDER — FENTANYL CITRATE (PF) 250 MCG/5ML IJ SOLN
INTRAMUSCULAR | Status: AC
  Filled 2016-03-11: qty 5

## 2016-03-11 MED ORDER — PROPOFOL 10 MG/ML IV BOLUS
INTRAVENOUS | Status: DC | PRN
  Administered 2016-03-11: 180 mg via INTRAVENOUS

## 2016-03-11 MED ORDER — PHENYLEPHRINE 40 MCG/ML (10ML) SYRINGE FOR IV PUSH (FOR BLOOD PRESSURE SUPPORT)
PREFILLED_SYRINGE | INTRAVENOUS | Status: AC
  Filled 2016-03-11: qty 10

## 2016-03-11 MED ORDER — SUGAMMADEX SODIUM 200 MG/2ML IV SOLN
INTRAVENOUS | Status: DC | PRN
  Administered 2016-03-11: 200 mg via INTRAVENOUS

## 2016-03-11 MED ORDER — HYDROMORPHONE HCL 1 MG/ML IJ SOLN
0.5000 mg | INTRAMUSCULAR | Status: DC | PRN
  Administered 2016-03-11 (×4): 0.5 mg via INTRAVENOUS

## 2016-03-11 MED ORDER — FENTANYL CITRATE (PF) 100 MCG/2ML IJ SOLN
INTRAMUSCULAR | Status: DC | PRN
  Administered 2016-03-11 (×2): 50 ug via INTRAVENOUS
  Administered 2016-03-11: 150 ug via INTRAVENOUS
  Administered 2016-03-11 (×2): 50 ug via INTRAVENOUS
  Administered 2016-03-11: 100 ug via INTRAVENOUS
  Administered 2016-03-11: 50 ug via INTRAVENOUS

## 2016-03-11 MED ORDER — IOPAMIDOL (ISOVUE-300) INJECTION 61%
INTRAVENOUS | Status: AC
  Filled 2016-03-11: qty 50

## 2016-03-11 MED ORDER — ONDANSETRON HCL 4 MG/2ML IJ SOLN
INTRAMUSCULAR | Status: DC | PRN
  Administered 2016-03-11: 4 mg via INTRAVENOUS

## 2016-03-11 SURGICAL SUPPLY — 63 items
ADH SKN CLS APL DERMABOND .7 (GAUZE/BANDAGES/DRESSINGS) ×1
APPLIER CLIP 5 13 M/L LIGAMAX5 (MISCELLANEOUS) ×2
APPLIER CLIP ROT 10 11.4 M/L (STAPLE) ×2
APR CLP MED LRG 11.4X10 (STAPLE) ×1
APR CLP MED LRG 5 ANG JAW (MISCELLANEOUS) ×1
BAG SPEC RTRVL 10 TROC 200 (ENDOMECHANICALS) ×1
BAG SPEC RTRVL LRG 6X4 10 (ENDOMECHANICALS) ×1
BLADE SURG ROTATE 9660 (MISCELLANEOUS) IMPLANT
CANISTER SUCTION 2500CC (MISCELLANEOUS) ×2 IMPLANT
CHLORAPREP W/TINT 26ML (MISCELLANEOUS) ×2 IMPLANT
CLIP APPLIE 5 13 M/L LIGAMAX5 (MISCELLANEOUS) ×1 IMPLANT
CLIP APPLIE ROT 10 11.4 M/L (STAPLE) IMPLANT
COVER MAYO STAND STRL (DRAPES) ×1 IMPLANT
COVER SURGICAL LIGHT HANDLE (MISCELLANEOUS) ×2 IMPLANT
CUTTER FLEX LINEAR 45M (STAPLE) ×1 IMPLANT
DECANTER SPIKE VIAL GLASS SM (MISCELLANEOUS) ×1 IMPLANT
DERMABOND ADVANCED (GAUZE/BANDAGES/DRESSINGS) ×1
DERMABOND ADVANCED .7 DNX12 (GAUZE/BANDAGES/DRESSINGS) ×1 IMPLANT
DRAPE C-ARM 42X72 X-RAY (DRAPES) ×1 IMPLANT
DRSG TEGADERM 2-3/8X2-3/4 SM (GAUZE/BANDAGES/DRESSINGS) ×8 IMPLANT
ELECT REM PT RETURN 9FT ADLT (ELECTROSURGICAL) ×2
ELECTRODE REM PT RTRN 9FT ADLT (ELECTROSURGICAL) ×1 IMPLANT
ENDOLOOP SUT PDS II  0 18 (SUTURE) ×1
ENDOLOOP SUT PDS II 0 18 (SUTURE) IMPLANT
GLOVE BIOGEL PI IND STRL 6 (GLOVE) IMPLANT
GLOVE BIOGEL PI IND STRL 6.5 (GLOVE) IMPLANT
GLOVE BIOGEL PI IND STRL 7.5 (GLOVE) IMPLANT
GLOVE BIOGEL PI IND STRL 8 (GLOVE) ×1 IMPLANT
GLOVE BIOGEL PI INDICATOR 6 (GLOVE) ×1
GLOVE BIOGEL PI INDICATOR 6.5 (GLOVE) ×1
GLOVE BIOGEL PI INDICATOR 7.5 (GLOVE) ×1
GLOVE BIOGEL PI INDICATOR 8 (GLOVE) ×2
GLOVE ECLIPSE 7.5 STRL STRAW (GLOVE) ×3 IMPLANT
GOWN STRL REUS W/ TWL LRG LVL3 (GOWN DISPOSABLE) ×3 IMPLANT
GOWN STRL REUS W/TWL LRG LVL3 (GOWN DISPOSABLE) ×6
KIT BASIN OR (CUSTOM PROCEDURE TRAY) ×2 IMPLANT
KIT ROOM TURNOVER OR (KITS) ×2 IMPLANT
L-HOOK LAP DISP 36CM (ELECTROSURGICAL) ×2
LHOOK LAP DISP 36CM (ELECTROSURGICAL) ×1 IMPLANT
NS IRRIG 1000ML POUR BTL (IV SOLUTION) ×2 IMPLANT
PAD ARMBOARD 7.5X6 YLW CONV (MISCELLANEOUS) ×2 IMPLANT
PENCIL BUTTON HOLSTER BLD 10FT (ELECTRODE) ×2 IMPLANT
POUCH RETRIEVAL ECOSAC 10 (ENDOMECHANICALS) IMPLANT
POUCH RETRIEVAL ECOSAC 10MM (ENDOMECHANICALS) ×1
POUCH SPECIMEN RETRIEVAL 10MM (ENDOMECHANICALS) ×1 IMPLANT
RELOAD STAPLE 45 3.5 BLU ETS (ENDOMECHANICALS) IMPLANT
RELOAD STAPLE TA45 3.5 REG BLU (ENDOMECHANICALS) ×2 IMPLANT
SCISSORS LAP 5X35 DISP (ENDOMECHANICALS) ×2 IMPLANT
SET CHOLANGIOGRAPH 5 50 .035 (SET/KITS/TRAYS/PACK) ×1 IMPLANT
SET IRRIG TUBING LAPAROSCOPIC (IRRIGATION / IRRIGATOR) ×2 IMPLANT
SHEARS HARMONIC ACE PLUS 36CM (ENDOMECHANICALS) ×1 IMPLANT
SLEEVE ENDOPATH XCEL 5M (ENDOMECHANICALS) ×4 IMPLANT
SPECIMEN JAR SMALL (MISCELLANEOUS) ×3 IMPLANT
STRIP CLOSURE SKIN 1/2X4 (GAUZE/BANDAGES/DRESSINGS) ×2 IMPLANT
SUT MNCRL AB 4-0 PS2 18 (SUTURE) ×2 IMPLANT
SUT VICRYL 0 UR6 27IN ABS (SUTURE) ×1 IMPLANT
TOWEL OR 17X24 6PK STRL BLUE (TOWEL DISPOSABLE) ×2 IMPLANT
TOWEL OR 17X26 10 PK STRL BLUE (TOWEL DISPOSABLE) ×2 IMPLANT
TRAY LAPAROSCOPIC MC (CUSTOM PROCEDURE TRAY) ×2 IMPLANT
TROCAR XCEL BLUNT TIP 100MML (ENDOMECHANICALS) ×2 IMPLANT
TROCAR XCEL NON-BLD 11X100MML (ENDOMECHANICALS) ×1 IMPLANT
TROCAR XCEL NON-BLD 5MMX100MML (ENDOMECHANICALS) ×2 IMPLANT
TUBING INSUFFLATION (TUBING) ×2 IMPLANT

## 2016-03-11 NOTE — Transfer of Care (Signed)
Immediate Anesthesia Transfer of Care Note  Patient: Cheryl Guzman  Procedure(s) Performed: Procedure(s): LAPAROSCOPIC CHOLECYSTECTOMY WITH ATTEMPTED INTRAOPERATIVE CHOLANGIOGRAM ([name]) APPENDECTOMY LAPAROSCOPIC ([name])  Patient Location: PACU  Anesthesia Type:General  Level of Consciousness: awake, alert , oriented and patient cooperative  Airway & Oxygen Therapy: Patient Spontanous Breathing and Patient connected to nasal cannula oxygen  Post-op Assessment: Report given to RN, Post -op Vital signs reviewed and stable and Patient moving all extremities  Post vital signs: Reviewed and stable  Last Vitals:  Vitals:   03/11/16 1227 03/11/16 1611  BP: 123/77   Pulse: 94   Resp: 16   Temp: 36.8 C 36.9 C    Last Pain:  Vitals:   03/11/16 1227  TempSrc: Oral  PainSc:       Patients Stated Pain Goal: 2 ([id number])  Complications: No apparent anesthesia complications

## 2016-03-11 NOTE — Op Note (Addendum)
OPERATIVE REPORT  DATE OF OPERATION:  03/11/2016  PATIENT:  Cheryl Guzman  50 y.o. female  PRE-OPERATIVE DIAGNOSIS:  acute cholecystitis  POST-OPERATIVE DIAGNOSIS:  Empyema of the gallbladder, periappendicitis with abnormally positioned appendix in the gallbladder fossa with attachment to the acutely inflamed gallbladder  FINDINGS:  Empyema of the gallbladder, periappendicitis with abnormally positioned appendix in the gallbladder fossa with attachment to the acutely inflamed gallbladder.  Large cystic duct requiring Endo-loop closure.  Pus in the sytic duct  PROCEDURE:  Procedure(s): LAPAROSCOPIC CHOLECYSTECTOMY WITH ATTEMPTED INTRAOPERATIVE CHOLANGIOGRAM APPENDECTOMY LAPAROSCOPIC  SURGEON:  Surgeon(s): Judeth Horn, MD Georganna Skeans, MD  ASSISTANT: Grandville Silos, M.D.  ANESTHESIA:   general  COMPLICATIONS:  None  EBL: 40 ml  BLOOD ADMINISTERED: none  DRAINS: none   SPECIMEN:  Source of Specimen:  Gallbladder and contents  COUNTS CORRECT:  YES  PROCEDURE DETAILS: The patient was taken to the operating room and placed on the table in the supine position.  After an adequate endotracheal anesthetic was administered, the patient was prepped with ChloroPrep, and then draped in the usual manner exposing the entire abdomen laterally, inferiorly and up  to the costal margins.  After a proper timeout was performed including identifying the patient and the procedure to be performed, a supraumbilical 99991111 midline incision was made using a #15 blade.  This was taken down to the fascia which was then incised with a #15 blade.  The edges of the fascia were tented up with Kocher clamps as the preperitoneal space was penetrated with a Kelly clamp into the peritoneum.  Once this was done, a pursestring suture of 0 Vicryl was passed around the fascial opening.  This was subsequently used to secure the [facility] cannula which was passed into the peritoneal cavity.  Once the [facility] cannula was in  place, carbon dioxide gas was insufflated into the peritoneal cavity up to a maximal intra-abdominal pressure of 32mm Hg.The laparoscope, with attached camera and light source, was passed into the peritoneal cavity to visualize the direct insertion of two right upper quadrant 79mm cannulas, and a sup-xiphoid 68mm cannula.    This was upsized to an 38mm cannula later in the case.  Once all cannulas were in place, the dissection was begun.  He was noted upon inspection of the gallbladder after removing omental adhesions to the gallbladder wall because of the acutely inflamed gallbladder that the appendix was also in the gallbladder fossa attached to the right lateral wall of the gallbladder which was acutely inflamed. The base of the cecum was in the right upper quadrant and there were adhesions tethering it in that area. Because of its proximity to the acutely inflamed gallbladder in its attachment to this area with evidence of periappendicitis laparoscopic appendectomy was performed.  In order to perform the laparoscopic appendectomy we used a Harmonic scalpel to take down the mesoappendix skeletonizing it to the base of the cecum. Only came across the base of the appendix using a blue cartridge Endo GIA stapler. Once the appendix was completely detached was retrieved from the abdominal cavity using an Endo Catch bag. Once this was done the patient was placed in reverse Trendelenburg and the left side was tilted down we began the dissection. In order to grasp the gallbladder and had to be decompressed using a needle aspirator.  We were able to dissect out the inflamed gallbladder at the infundibulum where a markedly enlarged cystic duct was noted. We were subsequently able to control using an Endo loop however we  cannot perform a cholangiogram..  Two ratcheted graspers were attached to the dome and infundibulum of the gallbladder and retracted towards the anterior abdominal wall and the right upper quadrant.   Using cautery attached to a dissecting forceps, the peritoneum overlaying the triangle of Chalot and the hepatoduodenal triangle was dissected away exposing the cystic duct and the cystic artery.  A critical window was developed between the CBD and the cystic duct The cystic artery was clipped proximally and distally then transected.  A clip was placed on the gallbladder side of the cystic duct, then  the distal cystic duct was closed with an EndoLoop device.  The gallbladder was then dissected out of the hepatic bed. There was significant inflammation in the gallbladder wall and entrance into the lumens without frank pus. At the end of the case this area was irrigated with close to 4-5 L of warm saline solution prior to closure. The gallbladder was retrieved from the abdomen (using an EndoCatch bag) without event.  Once the gallbladder was removed, the bed was inspected for hemostasis.  Significant irrigation was performed at this point. Once excellent hemostasis was obtained all gas and fluids were aspirated from above the liver, then the cannulas were removed.  The supraumbilical incision was closed using the pursestring suture which was in place.  0.25% bupivicaine with epinephrine was injected at all sites.  All 31mm or greater cannula sites were close using a running subcuticular stitch of 4-0 Monocryl.  5.15mm cannula sites were closed with Dermabond only.Steri-Strips and Tagaderm were used to complete the dressings at all sites.  At this point all needle, sponge, and instrument counts were correct.The patient was awakened from anesthesia and taken to the PACU in stable condition.  PATIENT DISPOSITION:  PACU - hemodynamically stable.   Chanise Habeck 7/31/20174:20 PM  The appendix is on top of the gallbladder.

## 2016-03-11 NOTE — Anesthesia Preprocedure Evaluation (Addendum)
Anesthesia Evaluation  Patient identified by MRN, date of birth, ID band Patient awake    Reviewed: Allergy & Precautions, NPO status , Patient's Chart, lab work & pertinent test results  Airway Mallampati: I  TM Distance: >3 FB Neck ROM: full    Dental  (+) Teeth Intact, Dental Advidsory Given   Pulmonary Current Smoker,    Pulmonary exam normal        Cardiovascular Normal cardiovascular exam     Neuro/Psych    GI/Hepatic (+) Hepatitis -, C  Endo/Other    Renal/GU      Musculoskeletal   Abdominal   Peds  Hematology   Anesthesia Other Findings   Reproductive/Obstetrics                            Anesthesia Physical Anesthesia Plan  ASA: II  Anesthesia Plan: General   Post-op Pain Management:    Induction: Intravenous  Airway Management Planned: Oral ETT  Additional Equipment:   Intra-op Plan:   Post-operative Plan: Extubation in OR  Informed Consent: I have reviewed the patients History and Physical, chart, labs and discussed the procedure including the risks, benefits and alternatives for the proposed anesthesia with the patient or authorized representative who has indicated his/her understanding and acceptance.   Dental Advisory Given  Plan Discussed with: CRNA, Anesthesiologist and Surgeon  Anesthesia Plan Comments:        Anesthesia Quick Evaluation

## 2016-03-11 NOTE — Anesthesia Procedure Notes (Addendum)
Procedure Name: Intubation Date/Time: 03/11/2016 2:03 PM Performed by: Rebekah Chesterfield L Pre-anesthesia Checklist: Patient identified, Emergency Drugs available, Suction available and Patient being monitored Patient Re-evaluated:Patient Re-evaluated prior to inductionOxygen Delivery Method: Circle System Utilized Preoxygenation: Pre-oxygenation with 100% oxygen Intubation Type: IV induction Ventilation: Mask ventilation without difficulty Laryngoscope Size: Miller and 2 Grade View: Grade I Tube type: Oral Number of attempts: 1 Airway Equipment and Method: Stylet and LTA kit utilized Placement Confirmation: ETT inserted through vocal cords under direct vision,  positive ETCO2 and breath sounds checked- equal and bilateral Secured at: 20 cm Tube secured with: Tape Dental Injury: Teeth and Oropharynx as per pre-operative assessment

## 2016-03-11 NOTE — Anesthesia Postprocedure Evaluation (Signed)
Anesthesia Post Note  Patient: Cheryl Guzman  Procedure(s) Performed: Procedure(s) (LRB): LAPAROSCOPIC CHOLECYSTECTOMY WITH ATTEMPTED INTRAOPERATIVE CHOLANGIOGRAM ([name]) APPENDECTOMY LAPAROSCOPIC ([name])  Patient location during evaluation: PACU Anesthesia Type: General Level of consciousness: awake Pain management: pain level controlled Respiratory status: spontaneous breathing Cardiovascular status: stable Anesthetic complications: no    Last Vitals:  Vitals:   03/11/16 1630 03/11/16 1645  BP: 134/86 (!) 143/80  Pulse:    Resp:    Temp:      Last Pain:  Vitals:   03/11/16 1655  TempSrc:   PainSc: 8                  EDWARDS,Jyron Turman

## 2016-03-11 NOTE — Progress Notes (Signed)
Still fairly tender in the RUQ.  No rebound but does guard.  For lap chole with IOC later today.  Kathryne Eriksson. Dahlia Bailiff, MD, [place] [phone] [facility] Surgery

## 2016-03-11 NOTE — Progress Notes (Addendum)
Pharmacy Antibiotic Note  Cheryl Guzman is a 50 y.o. female admitted on 03/10/2016 with abdominal pain.  Pharmacy has been consulted for Zosyn dosing for Intra-abdominal Infection. She is now s/p LAPAROSCOPIC CHOLECYSTECTOMY WITH ATTEMPTED INTRAOPERATIVE CHOLANGIOGRAM APPENDECTOMY LAPAROSCOPIC.  Plan: Zosyn 3.375 gm IV q8h (infuse each dose over 4 hours)   Height: 5\' 4"  (162.6 cm) Weight: 180 lb (81.6 kg) IBW/kg (Calculated) : 54.7  Temp (24hrs), Avg:98.4 F (36.9 C), Min:97.5 F (36.4 C), Max:99.1 F (37.3 C)   Recent Labs Lab 03/10/16 0846 03/11/16 0437  WBC 12.8* 10.1  CREATININE 0.70 0.65    Estimated Creatinine Clearance: 86 mL/min (by C-G formula based on SCr of 0.8 mg/dL).    No Known Allergies  Antimicrobials this admission: Zosyn 7/31 >>    Dose adjustments this admission:   Microbiology results:  7/31 surgical  MRSA PCR: negative  Thank you for allowing pharmacy to be a part of this patient's care. Nicole Cella, RPh Clinical Pharmacist Pager: [phone] 03/11/2016 6:03 PM

## 2016-03-12 ENCOUNTER — Encounter (HOSPITAL_COMMUNITY): Payer: Self-pay | Admitting: General Surgery

## 2016-03-12 LAB — CBC
HCT: 34.4 % — ABNORMAL LOW (ref 36.0–46.0)
Hemoglobin: 10.8 g/dL — ABNORMAL LOW (ref 12.0–15.0)
MCH: 28.6 pg (ref 26.0–34.0)
MCHC: 31.4 g/dL (ref 30.0–36.0)
MCV: 91 fL (ref 78.0–100.0)
PLATELETS: 228 10*3/uL (ref 150–400)
RBC: 3.78 MIL/uL — ABNORMAL LOW (ref 3.87–5.11)
RDW: 13.4 % (ref 11.5–15.5)
WBC: 9.8 10*3/uL (ref 4.0–10.5)

## 2016-03-12 NOTE — Progress Notes (Signed)
[facility] Progress Note  1 Day Post-Op  Subjective: Son in the room this morning. Abdominal pain 5/10. Worse with movement. Pain improving from yesterday. Tolerating diet without N/V. No flatus or BM. Ambulating. Denies fever/chills/night sweats. States the percocet does not give her much pain relief.  Objective: Vital signs in last 24 hours: Temp:  [97.5 F (36.4 C)-98.6 F (37 C)] 98.2 F (36.8 C) (08/01 0535) Pulse Rate:  [72-94] 94 (08/01 0535) Resp:  [15-22] 18 (08/01 0535) BP: (92-143)/(55-91) 116/80 (08/01 0535) SpO2:  [93 %-100 %] 93 % (08/01 0535) Last BM Date: 03/07/16  Intake/Output from previous day: 07/31 0701 - 08/01 0700 In: 2590 [P.O.:240; I.V.:2300; IV Piggyback:50] Out: 379 [Urine:750; Blood:25] Intake/Output this shift: No intake/output data recorded.  PE: Gen:  Alert, NAD, pleasant Card:  RRR, no M/G/R heard Pulm:  CTA, no W/R/R Abd: Soft, appropriately tender, mildly distended, good BS, no HSM, incisions C/D/I Ext:  No erythema, edema, or tenderness   Lab Results:   Recent Labs  03/10/16 0846 03/11/16 0437  WBC 12.8* 10.1  HGB 13.7 12.0  HCT 42.8 37.4  PLT 323 262   Hepatic Function Latest Ref Rng & Units 03/11/2016 03/10/2016  Total Protein 6.5 - 8.1 g/dL 6.5 7.9  Albumin 3.5 - 5.0 g/dL 3.1(L) 3.9  AST 15 - 41 U/L 14(L) 18  ALT 14 - 54 U/L 17 22  Alk Phosphatase 38 - 126 U/L 89 111  Total Bilirubin 0.3 - 1.2 mg/dL 0.6 0.5   BMET  Recent Labs  03/10/16 0846 03/11/16 0437  NA 136 136  K 3.7 3.6  CL 105 104  CO2 21* 24  GLUCOSE 134* 113*  BUN 13 <5*  CREATININE 0.70 0.65  CALCIUM 9.7 8.8*    Anti-infectives: Anti-infectives    Start     Dose/Rate Route Frequency Ordered Stop   03/11/16 1845  piperacillin-tazobactam (ZOSYN) IVPB 3.375 g     3.375 g 12.5 mL/hr over 240 Minutes Intravenous Every 8 hours 03/11/16 1815     03/10/16 1845  cefTRIAXone (ROCEPHIN) 2 g in dextrose 5 % 50 mL IVPB  Status:  Discontinued      2 g 100 mL/hr over 30 Minutes Intravenous Every 24 hours 03/10/16 1840 03/11/16 1735   03/10/16 1230  Ampicillin-Sulbactam (UNASYN) 3 g in sodium chloride 0.9 % 100 mL IVPB     3 g 100 mL/hr over 60 Minutes Intravenous  Once 03/10/16 1220 03/10/16 1348      Assessment/Plan Acute acalculous cholecystitis POD#1 S/p Laparoscopic cholecystectomy and laparoscopic appendectomy (03/11/16), Dr. Hulen Skains Empyema of gallbladder - Pharmacy consulted for zosyn dosing, appreciate their recommendations - afebrile, CBC pending (pre-operatively: 10.1)  FEN: soft diet ID: UNASYN 7/30, Rocephin 7/30-7/31, Zosyn 7/31 >> DVT Proph: lovenox, SCD's Plan: follow CBC, recommend 24 more hours on Zosyn before discharge. Possible discharge tomorrow if continues to clinically improve.   LOS: 2 days    Jill Alexanders , [facility] Surgery 03/12/2016, 8:54 AM Pager: [phone] Consults: [phone] Mon-Fri 7:00 am-4:30 pm Sat-Sun 7:00 am-11:30 am

## 2016-03-13 MED ORDER — OXYCODONE HCL 5 MG PO TABS: 5.0000 mg | ORAL_TABLET | ORAL | 0 refills | Status: DC | PRN

## 2016-03-13 MED ORDER — METRONIDAZOLE 500 MG PO TABS: 500.0000 mg | ORAL_TABLET | Freq: Three times a day (TID) | ORAL | 0 refills | Status: AC

## 2016-03-13 MED ORDER — BISACODYL 5 MG PO TBEC: 5.0000 mg | DELAYED_RELEASE_TABLET | Freq: Every day | ORAL | 0 refills | Status: AC | PRN

## 2016-03-13 MED ORDER — CIPROFLOXACIN HCL 500 MG PO TABS
500.0000 mg | ORAL_TABLET | Freq: Two times a day (BID) | ORAL | 0 refills | Status: DC
Start: 2016-03-13 — End: 2016-04-19

## 2016-03-13 NOTE — Progress Notes (Signed)
Patient discharged to home with instructions. 

## 2016-03-13 NOTE — Discharge Instructions (Signed)
Your appointment is at 11:30 AM on 03/27/16, please arrive at least 30 min before your appointment to complete your check in paperwork.  If you are unable to arrive 30 min prior to your appointment time we may have to cancel or reschedule you.  LAPAROSCOPIC SURGERY: POST OP INSTRUCTIONS  1. DIET: Follow a light bland diet the first 24 hours after arrival home, such as soup, liquids, crackers, etc. Be sure to include lots of fluids daily. Avoid fast food or heavy meals as your are more likely to get nauseated. Eat a low fat the next few days after surgery.  2. Take your usually prescribed home medications unless otherwise directed. 3. PAIN CONTROL:  1. Pain is best controlled by a usual combination of three different methods TOGETHER:  1. Ice/Heat 2. Over the counter pain medication 3. Prescription pain medication 2. Most patients will experience some swelling and bruising around the incisions. Ice packs or heating pads (30-60 minutes up to 6 times a day) will help. Use ice for the first few days to help decrease swelling and bruising, then switch to heat to help relax tight/sore spots and speed recovery. Some people prefer to use ice alone, heat alone, alternating between ice & heat. Experiment to what works for you. Swelling and bruising can take several weeks to resolve.  3. It is helpful to take an over-the-counter pain medication regularly for the first few weeks. Choose one of the following that works best for you:  1. Naproxen (Aleve, etc) Two 220mg  tabs twice a day 2. Ibuprofen (Advil, etc) Three 200mg  tabs four times a day (every meal & bedtime) 3. Acetaminophen (Tylenol, etc) 500-650mg  four times a day (every meal & bedtime) 4. A prescription for pain medication (such as oxycodone, hydrocodone, etc) should be given to you upon discharge. Take your pain medication as prescribed.  1. If you are having problems/concerns with the prescription medicine (does not control pain, nausea, vomiting,  rash, itching, etc), please call us [phone] to see if we need to switch you to a different pain medicine that will work better for you and/or control your side effect better. 2. If you need a refill on your pain medication, please contact your pharmacy. They will contact our office to request authorization. Prescriptions will not be filled after 5 pm or on week-ends. 4. Avoid getting constipated. Between the surgery and the pain medications, it is common to experience some constipation. Increasing fluid intake and taking a fiber supplement (such as Metamucil, Citrucel, FiberCon, MiraLax, etc) 1-2 times a day regularly will usually help prevent this problem from occurring. A mild laxative (prune juice, Milk of Magnesia, MiraLax, etc) should be taken according to package directions if there are no bowel movements after 48 hours.  5. Watch out for diarrhea. If you have many loose bowel movements, simplify your diet to bland foods & liquids for a few days. Stop any stool softeners and decrease your fiber supplement. Switching to mild anti-diarrheal medications (Kayopectate, Pepto Bismol) can help. If this worsens or does not improve, please call us. 6. Wash / shower every day. You may shower over the dressings as they are waterproof. Continue to shower over incision(s) after the dressing is off. 7. Remove your waterproof bandages 5 days after surgery. You may leave the incision open to air. You may replace a dressing/Band-Aid to cover the incision for comfort if you wish.  8. ACTIVITIES as tolerated:  1. You may resume regular (light) daily activities beginning the  next day--such as daily self-care, walking, climbing stairs--gradually increasing activities as tolerated. If you can walk 30 minutes without difficulty, it is safe to try more intense activity such as jogging, treadmill, bicycling, low-impact aerobics, swimming, etc. 2. Save the most intensive and strenuous activity for last such as sit-ups,  heavy lifting, contact sports, etc Refrain from any heavy lifting or straining until you are off narcotics for pain control.  3. DO NOT PUSH THROUGH PAIN. Let pain be your guide: If it hurts to do something, don't do it. Pain is your body warning you to avoid that activity for another week until the pain goes down. 4. You may drive when you are no longer taking prescription pain medication, you can comfortably wear a seatbelt, and you can safely maneuver your car and apply brakes. 5. You may have sexual intercourse when it is comfortable.  9. FOLLOW UP in our office  1. Please call [facility] at [phone] to set up an appointment to see your surgeon in the office for a follow-up appointment approximately 2-3 weeks after your surgery. 2. Make sure that you call for this appointment the day you arrive home to insure a convenient appointment time.      10. IF YOU HAVE DISABILITY OR FAMILY LEAVE FORMS, BRING THEM TO THE               OFFICE FOR PROCESSING.   WHEN TO CALL us [phone]:  1. Poor pain control 2. Reactions / problems with new medications (rash/itching, nausea, etc)  3. Fever over 101.5 F (38.5 C) 4. Inability to urinate 5. Nausea and/or vomiting 6. Worsening swelling or bruising 7. Continued bleeding from incision. 8. Increased pain, redness, or drainage from the incision  The clinic staff is available to answer your questions during regular business hours (8:30am-5pm). Please dont hesitate to call and ask to speak to one of our nurses for clinical concerns.  If you have a medical emergency, go to the nearest emergency room or call 911.  A surgeon from [facility] is always on call at the [facility], [place], [place], [place], [place] [address] ?  MAIN: [phone] ? TOLL FREE: [phone] ?  FAX [phone]  [email or web address]

## 2016-03-21 NOTE — Discharge Summary (Signed)
[facility] Discharge Summary   Patient ID: Cheryl Guzman MRN: [id number] DOB/AGE: [birth year] 50 y.o.  Admit date: 03/10/2016 Discharge date: 03/21/2016  Admitting Diagnosis: Acute acalculous cholecystitis   Discharge Diagnosis Patient Active Problem List   Diagnosis Date Noted  . Acute acalculous cholecystitis 03/10/2016  . Acute cholecystitis 03/10/2016    Consultants None   Imaging: RUQ U/S 03/10/16 - No visible stones within the gallbladder. There is layering sludge, wall thickening and tenderness over the gallbladder during the study. Findings concerning for acute acalculous cholecystitis.  Procedures Dr. Judeth Horn (03/11/16) - Laparoscopic Cholecystectomy with attempted IOC, laparoscopic appendectomy  Hospital Course:  50 y/o female who presented to [facility] with abdominal pain associated with nausea and vomiting.  RUQ ultrasound significant for acute acalculous cholecystitis.  Patient was admitted and underwent procedure listed above. During the procedure, inspection of the gallbladder after removing omental adhesions to the gallbladder wall that the appendix was also in the gallbladder fossa attached to the right lateral wall of the gallbladder, so both the gallbladder and appendix had to be removed. Patient tolerated procedure well and was transferred to the floor.  Diet was advanced as tolerated.  On POD#3, the patient was voiding well, tolerating diet, ambulating well, pain well controlled, vital signs stable, incisions c/d/i and felt stable for discharge home.  Patient will follow up in our office in 2 weeks and knows to call with questions or concerns.    Physical Exam: General:  Alert, NAD, pleasant, comfortable Abd:  Soft, ND, mild tenderness, incisions C/D/I    Medication List    TAKE these medications   acetaminophen 500 MG tablet Commonly known as:  TYLENOL Take 1,000 mg by mouth every 6 (six) hours as needed for moderate pain.   bisacodyl 5 MG  EC tablet Commonly known as:  DULCOLAX Take 1 tablet (5 mg total) by mouth daily as needed for moderate constipation.   ciprofloxacin 500 MG tablet Commonly known as:  CIPRO Take 1 tablet (500 mg total) by mouth 2 (two) times daily.   Omega-3 1000 MG Caps Take 1,000 mg by mouth at bedtime.   oxyCODONE 5 MG immediate release tablet Commonly known as:  Oxy IR/ROXICODONE Take 1-2 tablets (5-10 mg total) by mouth every 4 (four) hours as needed for moderate pain.     ASK your doctor about these medications   metroNIDAZOLE 500 MG tablet Commonly known as:  FLAGYL Take 1 tablet (500 mg total) by mouth 3 (three) times daily. Ask about: Should I take this medication?      Follow-up [facility] Follow up on 03/27/2016.   Specialty:  General Surgery Why:  your post-operative follow up appointment is at 11:30 AM. please arrive 30 minutes early to get checked in and fill out any necessary paperwork.  Contact information: [address] [place] [phone]          Signed: Obie Dredge, [facility] 03/21/2016, 1:03 PM Pager: [phone] Consults: [phone] Mon-Fri 7:00 am-4:30 pm Sat-Sun 7:00 am-11:30 am

## 2016-04-17 ENCOUNTER — Observation Stay (HOSPITAL_COMMUNITY): Payer: Self-pay

## 2016-04-17 ENCOUNTER — Emergency Department (HOSPITAL_COMMUNITY): Payer: Self-pay

## 2016-04-17 ENCOUNTER — Inpatient Hospital Stay (HOSPITAL_COMMUNITY)
Admission: EM | Admit: 2016-04-17 | Discharge: 2016-04-19 | DRG: 446 | Disposition: A | Discharge status: Home / Self Care | Payer: Self-pay | Attending: Nephrology | Admitting: Nephrology

## 2016-04-17 ENCOUNTER — Encounter (HOSPITAL_COMMUNITY): Payer: Self-pay | Admitting: Emergency Medicine

## 2016-04-17 DIAGNOSIS — Z8541 Personal history of malignant neoplasm of cervix uteri: Secondary | ICD-10-CM

## 2016-04-17 DIAGNOSIS — R740 Nonspecific elevation of levels of transaminase and lactic acid dehydrogenase [LDH]: Secondary | ICD-10-CM

## 2016-04-17 DIAGNOSIS — Z90710 Acquired absence of both cervix and uterus: Secondary | ICD-10-CM

## 2016-04-17 DIAGNOSIS — R1011 Right upper quadrant pain: Secondary | ICD-10-CM

## 2016-04-17 DIAGNOSIS — R945 Abnormal results of liver function studies: Secondary | ICD-10-CM | POA: Diagnosis present

## 2016-04-17 DIAGNOSIS — R748 Abnormal levels of other serum enzymes: Secondary | ICD-10-CM

## 2016-04-17 DIAGNOSIS — Z79899 Other long term (current) drug therapy: Secondary | ICD-10-CM

## 2016-04-17 DIAGNOSIS — R109 Unspecified abdominal pain: Secondary | ICD-10-CM

## 2016-04-17 DIAGNOSIS — K8050 Calculus of bile duct without cholangitis or cholecystitis without obstruction: Secondary | ICD-10-CM

## 2016-04-17 DIAGNOSIS — Z809 Family history of malignant neoplasm, unspecified: Secondary | ICD-10-CM

## 2016-04-17 DIAGNOSIS — R7989 Other specified abnormal findings of blood chemistry: Secondary | ICD-10-CM

## 2016-04-17 DIAGNOSIS — K830 Cholangitis: Principal | ICD-10-CM | POA: Diagnosis present

## 2016-04-17 DIAGNOSIS — K219 Gastro-esophageal reflux disease without esophagitis: Secondary | ICD-10-CM | POA: Diagnosis present

## 2016-04-17 DIAGNOSIS — K8309 Other cholangitis: Secondary | ICD-10-CM

## 2016-04-17 DIAGNOSIS — F17200 Nicotine dependence, unspecified, uncomplicated: Secondary | ICD-10-CM | POA: Diagnosis present

## 2016-04-17 DIAGNOSIS — R7401 Elevation of levels of liver transaminase levels: Secondary | ICD-10-CM

## 2016-04-17 DIAGNOSIS — B182 Chronic viral hepatitis C: Secondary | ICD-10-CM

## 2016-04-17 DIAGNOSIS — B1920 Unspecified viral hepatitis C without hepatic coma: Secondary | ICD-10-CM | POA: Diagnosis present

## 2016-04-17 HISTORY — DX: Malignant (primary) neoplasm, unspecified: C80.1

## 2016-04-17 LAB — COMPREHENSIVE METABOLIC PANEL
ALBUMIN: 4 g/dL (ref 3.5–5.0)
ALT: 638 U/L — AB (ref 14–54)
AST: 443 U/L — AB (ref 15–41)
Alkaline Phosphatase: 313 U/L — ABNORMAL HIGH (ref 38–126)
Anion gap: 12 (ref 5–15)
BUN: 8 mg/dL (ref 6–20)
CHLORIDE: 104 mmol/L (ref 101–111)
CO2: 19 mmol/L — AB (ref 22–32)
CREATININE: 0.76 mg/dL (ref 0.44–1.00)
Calcium: 9.7 mg/dL (ref 8.9–10.3)
GFR calc Af Amer: >60 mL/min (ref >60)
GFR calc non Af Amer: >60 mL/min (ref >60)
GLUCOSE: 151 mg/dL — AB (ref 65–99)
POTASSIUM: 3.8 mmol/L (ref 3.5–5.1)
SODIUM: 135 mmol/L (ref 135–145)
Total Bilirubin: 6 mg/dL — ABNORMAL HIGH (ref 0.3–1.2)
Total Protein: 7.6 g/dL (ref 6.5–8.1)

## 2016-04-17 LAB — CBC
HEMATOCRIT: 44.2 % (ref 36.0–46.0)
Hemoglobin: 14.5 g/dL (ref 12.0–15.0)
MCH: 29.2 pg (ref 26.0–34.0)
MCHC: 32.8 g/dL (ref 30.0–36.0)
MCV: 89.1 fL (ref 78.0–100.0)
PLATELETS: 238 10*3/uL (ref 150–400)
RBC: 4.96 MIL/uL (ref 3.87–5.11)
RDW: 14.2 % (ref 11.5–15.5)
WBC: 9.4 10*3/uL (ref 4.0–10.5)

## 2016-04-17 LAB — URINE MICROSCOPIC-ADD ON

## 2016-04-17 LAB — PROTIME-INR
INR: 1.16
PROTHROMBIN TIME: 14.9 s (ref 11.4–15.2)

## 2016-04-17 LAB — BILIRUBIN, DIRECT: BILIRUBIN DIRECT: 4 mg/dL — AB (ref 0.1–0.5)

## 2016-04-17 LAB — URINALYSIS, ROUTINE W REFLEX MICROSCOPIC
GLUCOSE, UA: 250 mg/dL — AB
KETONES UR: 40 mg/dL — AB
Nitrite: POSITIVE — AB
PH: 6.5 (ref 5.0–8.0)
Protein, ur: >300 mg/dL — AB
Specific Gravity, Urine: 1.025 (ref 1.005–1.030)

## 2016-04-17 LAB — LACTIC ACID, PLASMA: Lactic Acid, Venous: 0.7 mmol/L (ref 0.5–1.9)

## 2016-04-17 LAB — LIPASE, BLOOD: LIPASE: 24 U/L (ref 11–51)

## 2016-04-17 LAB — ACETAMINOPHEN LEVEL

## 2016-04-17 MED ORDER — MORPHINE SULFATE (PF) 2 MG/ML IV SOLN
2.0000 mg | INTRAVENOUS | Status: DC | PRN
  Administered 2016-04-17 – 2016-04-18 (×2): 2 mg via INTRAVENOUS
  Filled 2016-04-17 (×2): qty 1

## 2016-04-17 MED ORDER — SODIUM CHLORIDE 0.9 % IV BOLUS (SEPSIS)
1000.0000 mL | Freq: Once | INTRAVENOUS | Status: AC
  Administered 2016-04-17: 1000 mL via INTRAVENOUS

## 2016-04-17 MED ORDER — PIPERACILLIN-TAZOBACTAM 3.375 G IVPB 30 MIN
3.3750 g | Freq: Once | INTRAVENOUS | Status: AC
  Administered 2016-04-17: 3.375 g via INTRAVENOUS
  Filled 2016-04-17: qty 50

## 2016-04-17 MED ORDER — PIPERACILLIN-TAZOBACTAM 3.375 G IVPB
3.3750 g | Freq: Three times a day (TID) | INTRAVENOUS | Status: DC
  Administered 2016-04-18 – 2016-04-19 (×4): 3.375 g via INTRAVENOUS
  Filled 2016-04-17 (×7): qty 50

## 2016-04-17 MED ORDER — SODIUM CHLORIDE 0.9 % IV SOLN
INTRAVENOUS | Status: DC
  Administered 2016-04-17: via INTRAVENOUS

## 2016-04-17 MED ORDER — NICOTINE 21 MG/24HR TD PT24
21.0000 mg | MEDICATED_PATCH | Freq: Every day | TRANSDERMAL | Status: DC
  Filled 2016-04-17: qty 1

## 2016-04-17 MED ORDER — IOPAMIDOL (ISOVUE-300) INJECTION 61%
INTRAVENOUS | Status: AC
  Administered 2016-04-17: 100 mL
  Filled 2016-04-17: qty 100

## 2016-04-17 MED ORDER — SODIUM CHLORIDE 0.9% FLUSH
3.0000 mL | Freq: Two times a day (BID) | INTRAVENOUS | Status: DC
  Administered 2016-04-17: 3 mL via INTRAVENOUS

## 2016-04-17 MED ORDER — FAMOTIDINE 20 MG PO TABS
20.0000 mg | ORAL_TABLET | Freq: Every day | ORAL | Status: DC
  Filled 2016-04-17: qty 1

## 2016-04-17 MED ORDER — OXYCODONE HCL 5 MG PO TABS: 10.0000 mg | ORAL_TABLET | ORAL | Status: DC | PRN

## 2016-04-17 MED ORDER — PIPERACILLIN-TAZOBACTAM 3.375 G IVPB 30 MIN: 3.3750 g | Freq: Once | INTRAVENOUS | Status: DC

## 2016-04-17 MED ORDER — ONDANSETRON 4 MG PO TBDP
ORAL_TABLET | ORAL | Status: AC
  Filled 2016-04-17: qty 1

## 2016-04-17 MED ORDER — ONDANSETRON HCL 4 MG/2ML IJ SOLN: 4.0000 mg | Freq: Three times a day (TID) | INTRAMUSCULAR | Status: DC | PRN

## 2016-04-17 MED ORDER — HYDROMORPHONE HCL 1 MG/ML IJ SOLN
1.0000 mg | Freq: Once | INTRAMUSCULAR | Status: AC
  Administered 2016-04-17: 1 mg via INTRAVENOUS
  Filled 2016-04-17: qty 1

## 2016-04-17 MED ORDER — ONDANSETRON 4 MG PO TBDP
4.0000 mg | ORAL_TABLET | Freq: Once | ORAL | Status: AC | PRN
  Administered 2016-04-17: 4 mg via ORAL

## 2016-04-17 MED ORDER — ONDANSETRON HCL 4 MG/2ML IJ SOLN
4.0000 mg | Freq: Once | INTRAMUSCULAR | Status: AC
  Administered 2016-04-17: 4 mg via INTRAVENOUS
  Filled 2016-04-17: qty 2

## 2016-04-17 NOTE — ED Notes (Signed)
Patient transported to Ultrasound 

## 2016-04-17 NOTE — ED Triage Notes (Signed)
Pt states she had her gallbladder and appendix removed a month ago and now c/o pain in the RUQ and RLQ of her abdomen traveling to her back. Pt appears in severe pain

## 2016-04-17 NOTE — Progress Notes (Signed)
Pharmacy Antibiotic Note  Cheryl Guzman is a 50 y.o. female admitted on 04/17/2016 with abdominal pain.  Pharmacy has been consulted for zosyn dosing. Patient had gallbladder and appendix removed one month ago and is having severe pain of the RUQ/RLQ. Patient afebrile with normal WBC of 9.4. AST/ALT elevated at 443/638 and total bilirubin high at 6.0. Renal function stable.   Plan: Zosyn 3.375g IV every 8 hours (4 hour infusion) Monitor renal function and clinical picture Follow-up length of therapy   Height: 5\' 4"  (162.6 cm) Weight: 187 lb 5 oz (85 kg) IBW/kg (Calculated) : 54.7  Temp (24hrs), Avg:98.3 F (36.8 C), Min:98.3 F (36.8 C), Max:98.3 F (36.8 C)   Recent Labs Lab 04/17/16 1649  WBC 9.4  CREATININE 0.76    Estimated Creatinine Clearance: 87.7 mL/min (by C-G formula based on SCr of 0.8 mg/dL).    No Known Allergies  Antimicrobials this admission:  9/6 Zosyn >>  Dose adjustments this admission: [name]  Microbiology results: No cultures ordered   Thank you for allowing pharmacy to be a part of this patient's care.  Argie Ramming, PharmD Pharmacy Resident  Pager [phone] 04/17/16 9:58 PM

## 2016-04-17 NOTE — ED Provider Notes (Signed)
[facility] DEPT Provider Note   CSN: [id number] Arrival date & time: 04/17/16  1627     History   Chief Complaint Chief Complaint  Patient presents with  . Abdominal Pain    HPI Cheryl Guzman is a 50 y.o. female.  HPI 50 yo F s/p cholecystectomy and appendectomy on 7/31 who p/w RUQ and RLQ pain. Pt states that over the past 3-4 days, she has had progressively worsening aching, throbbing, abdominal pain. The pain is similar in location to her pre-operative pain. Denies any fever or chills but has had some nausea, vomiting, and difficulty breathing. Pain is made worse with eating and palpation. No alleviating factors. No dysuria or urinary frequency. No h/o kidney stones. No cough or SOB.  Past Medical History:  Diagnosis Date  . Cancer ([place])   . Hepatitis C     Patient Active Problem List   Diagnosis Date Noted  . Abnormal LFTs 04/17/2016  . Abdominal pain 04/17/2016  . Hepatitis C   . Acute acalculous cholecystitis 03/10/2016  . Acute cholecystitis 03/10/2016    Past Surgical History:  Procedure Laterality Date  . ABDOMINAL HYSTERECTOMY    . APPENDECTOMY    . CHOLECYSTECTOMY [name] 03/11/2016   Procedure: LAPAROSCOPIC CHOLECYSTECTOMY WITH ATTEMPTED INTRAOPERATIVE CHOLANGIOGRAM;  Surgeon: Judeth Horn, MD;  Location: [facility];  Service: General;  Laterality: [name];  . LAPAROSCOPIC APPENDECTOMY [name] 03/11/2016   Procedure: APPENDECTOMY LAPAROSCOPIC;  Surgeon: Judeth Horn, MD;  Location: [facility];  Service: General;  Laterality: [name];    OB History    No data available       Home Medications    Prior to Admission medications   Medication Sig Start Date End Date Taking? Authorizing Provider  acetaminophen (TYLENOL) 500 MG tablet Take 1,000 mg by mouth every 6 (six) hours as needed for moderate pain.   Yes Historical Provider, MD  ibuprofen (ADVIL,MOTRIN) 200 MG tablet Take 400 mg by mouth every 8 (eight) hours as needed for moderate pain.   Yes Historical Provider, MD    ranitidine (ZANTAC) 75 MG tablet Take 75 mg by mouth daily as needed for heartburn.   Yes Historical Provider, MD  bisacodyl (DULCOLAX) 5 MG EC tablet Take 1 tablet (5 mg total) by mouth daily as needed for moderate constipation. Patient not taking: Reported on 04/17/2016 03/13/16   Darci Current Simaan, PA-C  ciprofloxacin (CIPRO) 500 MG tablet Take 1 tablet (500 mg total) by mouth 2 (two) times daily. Patient not taking: Reported on 04/17/2016 03/13/16   Darci Current Simaan, PA-C  oxyCODONE (OXY IR/ROXICODONE) 5 MG immediate release tablet Take 1-2 tablets (5-10 mg total) by mouth every 4 (four) hours as needed for moderate pain. Patient not taking: Reported on 04/17/2016 03/13/16   Jill Alexanders, PA-C    Family History Family History  Problem Relation Age of Onset  . Cancer Mother   . Diabetes Mother   . Heart disease Mother   . Hyperlipidemia Mother   . Stroke Mother   . Diabetes Father   . Stroke Father     Social History Social History  Substance Use Topics  . Smoking status: Current Every Day Smoker  . Smokeless tobacco: Never Used  . Alcohol use Not on file     Allergies   Review of patient's allergies indicates no known allergies.   Review of Systems Review of Systems  Constitutional: Positive for chills, fatigue and fever.  HENT: Negative for congestion and rhinorrhea.   Eyes: Negative for visual  disturbance.  Respiratory: Negative for cough, shortness of breath and wheezing.   Cardiovascular: Negative for chest pain and leg swelling.  Gastrointestinal: Positive for abdominal pain, nausea and vomiting. Negative for diarrhea.  Genitourinary: Negative for dysuria, flank pain, vaginal bleeding and vaginal discharge.  Musculoskeletal: Negative for neck pain and neck stiffness.  Skin: Negative for rash and wound.  Allergic/Immunologic: Negative for immunocompromised state.  Neurological: Negative for syncope, weakness and headaches.  All other systems reviewed and are  negative.    Physical Exam Updated Vital Signs BP 133/68 (BP Location: Left Arm)   Pulse 83   Temp 98.5 F (36.9 C) (Oral)   Resp (!) 22   Ht 5\' 4"  (1.626 m)   Wt 187 lb 9.6 oz (85.1 kg)   SpO2 96%   BMI 32.20 kg/m   Physical Exam  Constitutional: She is oriented to person, place, and time. She appears well-developed and well-nourished. No distress.  HENT:  Head: Normocephalic and atraumatic.  Eyes: Conjunctivae are normal.  Neck: Neck supple.  Cardiovascular: Normal rate, regular rhythm and normal heart sounds.  Exam reveals no friction rub.   No murmur heard. Pulmonary/Chest: Effort normal and breath sounds normal. No respiratory distress. She has no wheezes. She has no rales.  Abdominal: Soft. Normal appearance. She exhibits no distension. There is tenderness in the right upper quadrant, right lower quadrant and epigastric area. There is guarding. There is no rigidity, no rebound and no CVA tenderness.  Musculoskeletal: She exhibits no edema.  Neurological: She is alert and oriented to person, place, and time. She exhibits normal muscle tone.  Skin: Skin is warm. Capillary refill takes less than 2 seconds.  Psychiatric: She has a normal mood and affect.  Nursing note and vitals reviewed.    ED Treatments / Results  Labs (all labs ordered are listed, but only abnormal results are displayed) Labs Reviewed  COMPREHENSIVE METABOLIC PANEL - Abnormal; Notable for the following:       Result Value   CO2 19 (*)    Glucose, Bld 151 (*)    AST 443 (*)    ALT 638 (*)    Alkaline Phosphatase 313 (*)    Total Bilirubin 6.0 (*)    All other components within normal limits  URINALYSIS, ROUTINE W REFLEX MICROSCOPIC (NOT AT [facility] - Abnormal; Notable for the following:    Color, Urine ORANGE (*)    APPearance TURBID (*)    Glucose, UA 250 (*)    Hgb urine dipstick SMALL (*)    Bilirubin Urine LARGE (*)    Ketones, ur 40 (*)    Protein, ur >300 (*)    Nitrite POSITIVE (*)     Leukocytes, UA TRACE (*)    All other components within normal limits  URINE MICROSCOPIC-ADD ON - Abnormal; Notable for the following:    Squamous Epithelial / LPF 6-30 (*)    Bacteria, UA MANY (*)    All other components within normal limits  ACETAMINOPHEN LEVEL - Abnormal; Notable for the following:    Acetaminophen (Tylenol), Serum <10 (*)    All other components within normal limits  BILIRUBIN, DIRECT - Abnormal; Notable for the following:    Bilirubin, Direct 4.0 (*)    All other components within normal limits  LIPASE, BLOOD  CBC  LACTIC ACID, PLASMA  PROTIME-INR  HEPATITIS PANEL, ACUTE  HIV ANTIBODY (ROUTINE TESTING)  COMPREHENSIVE METABOLIC PANEL  CBC    EKG  EKG Interpretation  Date/Time:  Wednesday April 17 2016 22:38:38 EDT Ventricular Rate:  93 PR Interval:    QRS Duration: 92 QT Interval:  344 QTC Calculation: 428 R Axis:   21 Text Interpretation:  Sinus rhythm Low voltage, precordial leads No old tracing to compare EKG WITHIN NORMAL LIMITS Confirmed by Ellender Hose MD, Lysbeth Galas [phone]) on 04/18/2016 1:01:00 AM       Radiology Ct Abdomen Pelvis W Contrast  Result Date: 04/17/2016 CLINICAL DATA:  Diffuse abdominal pain with nausea and vomiting for 2 days, initial encounter EXAM: CT ABDOMEN AND PELVIS WITH CONTRAST TECHNIQUE: Multidetector CT imaging of the abdomen and pelvis was performed using the standard protocol following bolus administration of intravenous contrast. CONTRAST:  100 mL ISOVUE-300 IOPAMIDOL (ISOVUE-300) INJECTION 61% COMPARISON:  None. FINDINGS: Lower chest: Lung bases demonstrate bibasilar atelectatic changes. No sizable effusion is seen. Small hiatal hernia is noted. Hepatobiliary: Diffuse fatty infiltration of the liver is noted. The gallbladder has been surgically removed. Fullness of the biliary tree is noted secondary to the post cholecystectomy state. Some minimal postoperative changes are noted in the gallbladder fossa although no sizable fluid  collection is seen. Pancreas: No mass, inflammatory changes, or other significant abnormality. Spleen: Within normal limits in size and appearance. Adrenals/Urinary Tract: A 2.5 cm hypodense lesion is noted in the left adrenal gland likely representing an adenoma. This is slightly larger than that seen on a prior exam in 2006 but still felt to be likely related to an adenoma. A smaller right adrenal lesion is noted also likely representing an adenoma. Kidneys are within normal limits bilaterally. No renal calculi are seen. Normal excretion is noted on delayed images. The bladder is decompressed. Stomach/Bowel: The appendix has been surgically removed. No obstructive changes are noted. Vascular/Lymphatic: Scattered aortoiliac calcifications are noted. No significant lymphadenopathy is noted. Reproductive: The uterus has been surgically removed. Other: None. Musculoskeletal:  Degenerative changes of lumbar spine are noted. IMPRESSION: Bilateral adrenal masses consistent with adenomas. Postsurgical changes as described. Mild postsurgical changes in the gallbladder fossa related to the recent cholecystectomy. Bibasilar atelectatic changes without focal confluent infiltrate. Electronically Signed   By: Inez Catalina M.D.   On: 04/17/2016 20:24   US Abdomen Limited Ruq  Result Date: 04/17/2016 CLINICAL DATA:  50 year old female with right upper quadrant abdominal pain. History of hepatitis-C. EXAM: US ABDOMEN LIMITED - RIGHT UPPER QUADRANT COMPARISON:  CT dated 04/17/2016 FINDINGS: Gallbladder: Cholecystectomy. Common bile duct: Diameter: 7 mm Liver: There is diffuse heterogeneous hepatic echotexture with increased echogenicity, likely related to underlying fatty infiltration. IMPRESSION: Fatty liver. Cholecystectomy. Electronically Signed   By: Anner Crete M.D.   On: 04/17/2016 23:34    Procedures Procedures (including critical care time)  Medications Ordered in ED Medications  ondansetron (ZOFRAN-ODT) 4  MG disintegrating tablet (not administered)  piperacillin-tazobactam (ZOSYN) IVPB 3.375 g (not administered)  ondansetron (ZOFRAN) injection 4 mg (not administered)  oxyCODONE (Oxy IR/ROXICODONE) immediate release tablet 10 mg (not administered)  famotidine (PEPCID) tablet 20 mg (not administered)  0.9 %  sodium chloride infusion ( Intravenous New Bag/Given 04/17/16 2335)  morphine 2 MG/ML injection 2 mg (2 mg Intravenous Given 04/17/16 2357)  nicotine (NICODERM CQ - dosed in mg/24 hours) patch 21 mg (not administered)  sodium chloride flush (NS) 0.9 % injection 3 mL (3 mLs Intravenous Given 04/17/16 2336)  chlorhexidine (PERIDEX) 0.12 % solution 15 mL (15 mLs Mouth Rinse Not Given 04/18/16 0045)  MEDLINE mouth rinse (not administered)  ondansetron (ZOFRAN-ODT) disintegrating tablet 4 mg (4 mg Oral Given 04/17/16 1636)  sodium chloride 0.9 % bolus 1,000 mL (0 mLs Intravenous Stopped 04/17/16 2150)  HYDROmorphone (DILAUDID) injection 1 mg (1 mg Intravenous Given 04/17/16 1929)  ondansetron (ZOFRAN) injection 4 mg (4 mg Intravenous Given 04/17/16 1929)  sodium chloride 0.9 % bolus 1,000 mL (0 mLs Intravenous Stopped 04/17/16 2133)  iopamidol (ISOVUE-300) 61 % injection (100 mLs  Contrast Given 04/17/16 1956)  piperacillin-tazobactam (ZOSYN) IVPB 3.375 g (0 g Intravenous Stopped 04/17/16 2235)     Initial Impression / Assessment and Plan / ED Course  I have reviewed the triage vital signs and the nursing notes.  Pertinent labs & imaging results that were available during my care of the patient were reviewed by me and considered in my medical decision making (see chart for details).  Clinical Course   50 yo F with PMHx of appendectomy and cholecystectomy 7/31 who p/w worsening right-sided abdominal pain. No fevers but has had chills, night sweats at home. Exam c/f significant RUQ, epigastric, RLQ TTP. DDx includes: post-op abscess, hepatitis, pancreatitis, cholangitis, retained stone. Will check CT, labs,  re-assess.  Labs c/f significant transaminitis and elevated Bili/AlkP concerning for obstructive process. CT however shows no acute abnormality. DDx includes CBD stone, also cholangitis though WBC normal. Will give Zosyn, admit to medicine. GI consulted and will see in AM. Recommends NPO at mN.  Final Clinical Impressions(s) / ED Diagnoses   Final diagnoses:  RUQ pain  Transaminitis  Elevated alkaline phosphatase level  Hyperbilirubinemia    New Prescriptions Current Discharge Medication List       Duffy Bruce, MD 04/18/16 [phone]

## 2016-04-17 NOTE — ED Notes (Signed)
RN attempted to call report x 2; RN to call back

## 2016-04-17 NOTE — ED Notes (Signed)
Attempted to call report at this time 

## 2016-04-17 NOTE — H&P (Signed)
History and Physical    Cheryl Guzman [id number] DOB: Sep 27, 1965 DOA: 04/17/2016  Referring MD/NP/PA:   PCP: No PCP Per Patient   Patient coming from:  The patient is coming from home.  At baseline, pt is independent for most of ADL.      Chief Complaint: Abdominal pain, nausea and vomiting  HPI: Cheryl Guzman is a 50 y.o. female with medical history significant of hepatitis C (never treated), early stage of cervical cancer (stage IA, s/p of hysterectomy 10 years ago), cholecystitis, tobacco abuse, GERD, s/p of recent laparoscopic cholecystectomy and appendectomy, who presents with abdominal pain, nausea, vomiting.  Patient recently and went laparoscopic cholecystectomy and appendectomy on 03/11/16. She has been doing well until 3 days ago, when she started having abdominal pain, nausea and vomiting. Her abdominal pain is located in the right upper quadrant and epigastric area, constant, 10 out of 10 in severity, sharp and also burning like pain, radiating to the back. It is aggravated by eating food. She vomited 4-6 times each day. No diarrhea. She has a chills, but no fever. Patient denies symptoms of UTI, chest pain, shortness of breath, unilateral weakness. Patient states that she drinks small amount of wine occasionally. She has been taking 7-8 pills of 500 mg Tylenol each day recently.  ED Course: pt was found to have abnormal liver function with ALP 313, AST 443, ALT 638, total bilirubin 6.0 (her liver function was normal on 03/11/16), direct bilirubin 4.0, Tylenol level less than 10, lactic acid 0.7, WBC 9.4, potassium normal, creatinine normal, bicarbonate 19, anion gap 12, temperature normal, transient tachycardia, oxygen saturation 92-93% on room air. CT abdomen/pelvis showed mild postsurgical changes in the gallbladder fossa related to the recent cholecystectomy, bilateral adrenal masses consistent with adenomas, bibasilar atelectatic changes without focal confluent infiltrate. Pt  is placed on tele bed obs. GI will be consulted by EDP.   Review of Systems:   General: no fevers, has chills, no changes in body weight, has poor appetite, has fatigue HEENT: no blurry vision, hearing changes or sore throat Respiratory: no dyspnea, coughing, wheezing CV: no chest pain, no palpitations GI: has nausea, vomiting, abdominal pain, no diarrhea, constipation GU: no dysuria, burning on urination, increased urinary frequency, hematuria  Ext: no leg edema Neuro: no unilateral weakness, numbness, or tingling, no vision change or hearing loss Skin: no rash, no skin tear. MSK: No muscle spasm, no deformity, no limitation of range of movement in spin Heme: No easy bruising.  Travel history: No recent long distant travel.  Allergy: No Known Allergies  Past Medical History:  Diagnosis Date  . Cancer ([place])   . Hepatitis C     Past Surgical History:  Procedure Laterality Date  . ABDOMINAL HYSTERECTOMY    . APPENDECTOMY    . CHOLECYSTECTOMY [name] 03/11/2016   Procedure: LAPAROSCOPIC CHOLECYSTECTOMY WITH ATTEMPTED INTRAOPERATIVE CHOLANGIOGRAM;  Surgeon: Judeth Horn, MD;  Location: [facility];  Service: General;  Laterality: [name];  . LAPAROSCOPIC APPENDECTOMY [name] 03/11/2016   Procedure: APPENDECTOMY LAPAROSCOPIC;  Surgeon: Judeth Horn, MD;  Location: [facility];  Service: General;  Laterality: [name];    Social History:  reports that she has been smoking.  She has never used smokeless tobacco. Her alcohol and drug histories are not on file.  Family History:  Family History  Problem Relation Age of Onset  . Cancer Mother   . Diabetes Mother   . Heart disease Mother   . Hyperlipidemia Mother   . Stroke Mother   .  Diabetes Father   . Stroke Father      Prior to Admission medications   Medication Sig Start Date End Date Taking? Authorizing Provider  acetaminophen (TYLENOL) 500 MG tablet Take 1,000 mg by mouth every 6 (six) hours as needed for moderate pain.   Yes Historical Provider, MD    ibuprofen (ADVIL,MOTRIN) 200 MG tablet Take 400 mg by mouth every 8 (eight) hours as needed for moderate pain.   Yes Historical Provider, MD  ranitidine (ZANTAC) 75 MG tablet Take 75 mg by mouth daily as needed for heartburn.   Yes Historical Provider, MD  bisacodyl (DULCOLAX) 5 MG EC tablet Take 1 tablet (5 mg total) by mouth daily as needed for moderate constipation. Patient not taking: Reported on 04/17/2016 03/13/16   Darci Current Simaan, PA-C  ciprofloxacin (CIPRO) 500 MG tablet Take 1 tablet (500 mg total) by mouth 2 (two) times daily. Patient not taking: Reported on 04/17/2016 03/13/16   Darci Current Simaan, PA-C  oxyCODONE (OXY IR/ROXICODONE) 5 MG immediate release tablet Take 1-2 tablets (5-10 mg total) by mouth every 4 (four) hours as needed for moderate pain. Patient not taking: Reported on 04/17/2016 03/13/16   Jill Alexanders, PA-C    Physical Exam: Vitals:   04/17/16 2134 04/17/16 2135 04/17/16 2201 04/17/16 2230  BP: 130/88  124/86 114/82  Pulse:  93 90 89  Resp:      Temp:      TempSrc:      SpO2:  96% 95% 93%  Weight:      Height:       General: Not in acute distress HEENT:       Eyes: PERRL, EOMI, has scleral icterus.       ENT: No discharge from the ears and nose, no pharynx injection, no tonsillar enlargement.        Neck: No JVD, no bruit, no mass felt. Heme: No neck lymph node enlargement. Cardiac: S1/S2, RRR, No murmurs, No gallops or rubs. Respiratory: No rales, wheezing, rhonchi or rubs. GI: Soft, nondistended, has tenderness over RUQ and epigastric area, no rebound pain, no organomegaly, BS present. GU: No hematuria Ext: No pitting leg edema bilaterally. 2+DP/PT pulse bilaterally. Musculoskeletal: No joint deformities, No joint redness or warmth, no limitation of ROM in spin. Skin: No rashes.  Neuro: Alert, oriented X3, cranial nerves II-XII grossly intact, moves all extremities normally. Psych: Patient is not psychotic, no suicidal or hemocidal ideation.  Labs  on Admission: I have personally reviewed following labs and imaging studies  CBC:  Recent Labs Lab 04/17/16 1649  WBC 9.4  HGB 14.5  HCT 44.2  MCV 89.1  PLT 99991111   Basic Metabolic Panel:  Recent Labs Lab 04/17/16 1649  NA 135  K 3.8  CL 104  CO2 19*  GLUCOSE 151*  BUN 8  CREATININE 0.76  CALCIUM 9.7   GFR: Estimated Creatinine Clearance: 87.7 mL/min (by C-G formula based on SCr of 0.8 mg/dL). Liver Function Tests:  Recent Labs Lab 04/17/16 1649  AST 443*  ALT 638*  ALKPHOS 313*  BILITOT 6.0*  PROT 7.6  ALBUMIN 4.0    Recent Labs Lab 04/17/16 1649  LIPASE 24   No results for input(s): AMMONIA in the last 168 hours. Coagulation Profile: No results for input(s): INR, PROTIME in the last 168 hours. Cardiac Enzymes: No results for input(s): CKTOTAL, CKMB, CKMBINDEX, TROPONINI in the last 168 hours. BNP (last 3 results) No results for input(s): PROBNP in the last 8760 hours.  HbA1C: No results for input(s): HGBA1C in the last 72 hours. CBG: No results for input(s): GLUCAP in the last 168 hours. Lipid Profile: No results for input(s): CHOL, HDL, LDLCALC, TRIG, CHOLHDL, LDLDIRECT in the last 72 hours. Thyroid Function Tests: No results for input(s): TSH, T4TOTAL, FREET4, T3FREE, THYROIDAB in the last 72 hours. Anemia Panel: No results for input(s): VITAMINB12, FOLATE, FERRITIN, TIBC, IRON, RETICCTPCT in the last 72 hours. Urine analysis:    Component Value Date/Time   COLORURINE ORANGE (A) 04/17/2016 1640   APPEARANCEUR TURBID (A) 04/17/2016 1640   LABSPEC 1.025 04/17/2016 1640   PHURINE 6.5 04/17/2016 1640   GLUCOSEU 250 (A) 04/17/2016 1640   HGBUR SMALL (A) 04/17/2016 1640   BILIRUBINUR LARGE (A) 04/17/2016 1640   KETONESUR 40 (A) 04/17/2016 1640   PROTEINUR >300 (A) 04/17/2016 1640   NITRITE POSITIVE (A) 04/17/2016 1640   LEUKOCYTESUR TRACE (A) 04/17/2016 1640   Sepsis Labs: @LABRCNTIP (procalcitonin:4,lacticidven:4) )No results found for this  or any previous visit (from the past 240 hour(s)).   Radiological Exams on Admission: Ct Abdomen Pelvis W Contrast  Result Date: 04/17/2016 CLINICAL DATA:  Diffuse abdominal pain with nausea and vomiting for 2 days, initial encounter EXAM: CT ABDOMEN AND PELVIS WITH CONTRAST TECHNIQUE: Multidetector CT imaging of the abdomen and pelvis was performed using the standard protocol following bolus administration of intravenous contrast. CONTRAST:  100 mL ISOVUE-300 IOPAMIDOL (ISOVUE-300) INJECTION 61% COMPARISON:  None. FINDINGS: Lower chest: Lung bases demonstrate bibasilar atelectatic changes. No sizable effusion is seen. Small hiatal hernia is noted. Hepatobiliary: Diffuse fatty infiltration of the liver is noted. The gallbladder has been surgically removed. Fullness of the biliary tree is noted secondary to the post cholecystectomy state. Some minimal postoperative changes are noted in the gallbladder fossa although no sizable fluid collection is seen. Pancreas: No mass, inflammatory changes, or other significant abnormality. Spleen: Within normal limits in size and appearance. Adrenals/Urinary Tract: A 2.5 cm hypodense lesion is noted in the left adrenal gland likely representing an adenoma. This is slightly larger than that seen on a prior exam in 2006 but still felt to be likely related to an adenoma. A smaller right adrenal lesion is noted also likely representing an adenoma. Kidneys are within normal limits bilaterally. No renal calculi are seen. Normal excretion is noted on delayed images. The bladder is decompressed. Stomach/Bowel: The appendix has been surgically removed. No obstructive changes are noted. Vascular/Lymphatic: Scattered aortoiliac calcifications are noted. No significant lymphadenopathy is noted. Reproductive: The uterus has been surgically removed. Other: None. Musculoskeletal:  Degenerative changes of lumbar spine are noted. IMPRESSION: Bilateral adrenal masses consistent with adenomas.  Postsurgical changes as described. Mild postsurgical changes in the gallbladder fossa related to the recent cholecystectomy. Bibasilar atelectatic changes without focal confluent infiltrate. Electronically Signed   By: Inez Catalina M.D.   On: 04/17/2016 20:24     EKG: Independently reviewed. Sinus rhythm, QTC 428, low voltage, nonspecific T-wave change.  Assessment/Plan Principal Problem:   Abdominal pain Active Problems:   Hepatitis C   Abnormal LFTs   Abdominal pain and abnormal LFTs: Etiology is not clear. CT abdomen/pelvis findings are not impressive. Patient states that she has been taking Tylenol, but Tylenol level is less than 10. She has history of hepatitis C, which was never treated, and may have partially contributed. Given significant elevated bilirubin, concerning for biliary obstruction and cholangitis. GI was consulted by EDP, will see in AM.  -will admit to tele bed as inpt -check hepatitis panel, HIV  ab -US-abd -IV zosyn was started in ED, will continue -IVF: 2L NS, and then 125 cc/h -f/u GI recommendation  GERD: -Pepcid  Tobacco abuse: -Did counseling about importance of quitting smoking -Nicotine patch  DVT ppx: SCD Code Status: Full code Family Communication: None at bed side.  Disposition Plan:  Anticipate discharge back to previous home environment Consults called:  GI will be consulted by EDP Admission status: Obs / tele         Date of Service 04/17/2016    Ivor Costa [facility] Pager [phone]  If 7PM-7AM, please contact night-coverage [email or web address] Password [email or web address] 04/17/2016, 10:39 PM

## 2016-04-17 NOTE — ED Notes (Signed)
Pt returned from radiology at this time via ED stretcher at this time.  Pt in no apparent distress at this time.

## 2016-04-18 ENCOUNTER — Encounter (HOSPITAL_COMMUNITY): Payer: Self-pay | Admitting: *Deleted

## 2016-04-18 ENCOUNTER — Inpatient Hospital Stay (HOSPITAL_COMMUNITY): Payer: Self-pay | Admitting: Anesthesiology

## 2016-04-18 ENCOUNTER — Inpatient Hospital Stay (HOSPITAL_COMMUNITY): Payer: Self-pay

## 2016-04-18 ENCOUNTER — Encounter (HOSPITAL_COMMUNITY)
Admission: EM | Disposition: A | Discharge status: Home / Self Care | Payer: Self-pay | Source: Home / Self Care | Attending: Nephrology

## 2016-04-18 DIAGNOSIS — F17200 Nicotine dependence, unspecified, uncomplicated: Secondary | ICD-10-CM

## 2016-04-18 DIAGNOSIS — K219 Gastro-esophageal reflux disease without esophagitis: Secondary | ICD-10-CM | POA: Diagnosis present

## 2016-04-18 DIAGNOSIS — K830 Cholangitis: Principal | ICD-10-CM

## 2016-04-18 DIAGNOSIS — K8309 Other cholangitis: Secondary | ICD-10-CM

## 2016-04-18 HISTORY — PX: ERCP: SHX5425

## 2016-04-18 LAB — COMPREHENSIVE METABOLIC PANEL
ALBUMIN: 3.3 g/dL — AB (ref 3.5–5.0)
ALT: 397 U/L — ABNORMAL HIGH (ref 14–54)
ANION GAP: 11 (ref 5–15)
AST: 159 U/L — ABNORMAL HIGH (ref 15–41)
Alkaline Phosphatase: 268 U/L — ABNORMAL HIGH (ref 38–126)
BILIRUBIN TOTAL: 5.4 mg/dL — AB (ref 0.3–1.2)
BUN: 5 mg/dL — ABNORMAL LOW (ref 6–20)
CO2: 18 mmol/L — ABNORMAL LOW (ref 22–32)
Calcium: 8.8 mg/dL — ABNORMAL LOW (ref 8.9–10.3)
Chloride: 105 mmol/L (ref 101–111)
Creatinine, Ser: 0.67 mg/dL (ref 0.44–1.00)
GFR calc non Af Amer: >60 mL/min (ref >60)
GLUCOSE: 102 mg/dL — AB (ref 65–99)
POTASSIUM: 3.7 mmol/L (ref 3.5–5.1)
Sodium: 134 mmol/L — ABNORMAL LOW (ref 135–145)
TOTAL PROTEIN: 6.8 g/dL (ref 6.5–8.1)

## 2016-04-18 LAB — CBC
HEMATOCRIT: 38.4 % (ref 36.0–46.0)
HEMOGLOBIN: 12.8 g/dL (ref 12.0–15.0)
MCH: 29.4 pg (ref 26.0–34.0)
MCHC: 33.3 g/dL (ref 30.0–36.0)
MCV: 88.3 fL (ref 78.0–100.0)
Platelets: 174 10*3/uL (ref 150–400)
RBC: 4.35 MIL/uL (ref 3.87–5.11)
RDW: 14.5 % (ref 11.5–15.5)
WBC: 10.7 10*3/uL — AB (ref 4.0–10.5)

## 2016-04-18 LAB — HIV ANTIBODY (ROUTINE TESTING W REFLEX): HIV SCREEN 4TH GENERATION: NONREACTIVE

## 2016-04-18 LAB — GLUCOSE, CAPILLARY: GLUCOSE-CAPILLARY: 113 mg/dL — AB (ref 65–99)

## 2016-04-18 SURGERY — ERCP, WITH INTERVENTION IF INDICATED
Anesthesia: General

## 2016-04-18 MED ORDER — FENTANYL CITRATE (PF) 100 MCG/2ML IJ SOLN
INTRAMUSCULAR | Status: DC | PRN
  Administered 2016-04-18: 100 ug via INTRAVENOUS

## 2016-04-18 MED ORDER — ONDANSETRON HCL 4 MG/2ML IJ SOLN
INTRAMUSCULAR | Status: DC | PRN
  Administered 2016-04-18: 4 mg via INTRAVENOUS

## 2016-04-18 MED ORDER — DEXAMETHASONE SODIUM PHOSPHATE 10 MG/ML IJ SOLN
INTRAMUSCULAR | Status: DC | PRN
  Administered 2016-04-18: 10 mg via INTRAVENOUS

## 2016-04-18 MED ORDER — ORAL CARE MOUTH RINSE
15.0000 mL | Freq: Two times a day (BID) | OROMUCOSAL | Status: DC
  Administered 2016-04-18 (×2): 15 mL via OROMUCOSAL

## 2016-04-18 MED ORDER — PROPOFOL 10 MG/ML IV BOLUS
INTRAVENOUS | Status: DC | PRN
  Administered 2016-04-18: 160 mg via INTRAVENOUS
  Administered 2016-04-18: 40 mg via INTRAVENOUS

## 2016-04-18 MED ORDER — MIDAZOLAM HCL 5 MG/5ML IJ SOLN
INTRAMUSCULAR | Status: DC | PRN
  Administered 2016-04-18 (×2): 1 mg via INTRAVENOUS

## 2016-04-18 MED ORDER — ROCURONIUM BROMIDE 10 MG/ML (PF) SYRINGE
PREFILLED_SYRINGE | INTRAVENOUS | Status: DC | PRN
  Administered 2016-04-18: 40 mg via INTRAVENOUS

## 2016-04-18 MED ORDER — IOPAMIDOL (ISOVUE-300) INJECTION 61%
INTRAVENOUS | Status: AC
  Filled 2016-04-18: qty 50

## 2016-04-18 MED ORDER — LACTATED RINGERS IV SOLN
INTRAVENOUS | Status: DC | PRN
  Administered 2016-04-18 (×2): via INTRAVENOUS

## 2016-04-18 MED ORDER — FENTANYL CITRATE (PF) 100 MCG/2ML IJ SOLN: 25.0000 ug | INTRAMUSCULAR | Status: DC | PRN

## 2016-04-18 MED ORDER — SODIUM CHLORIDE 0.9 % IV SOLN: INTRAVENOUS | Status: DC

## 2016-04-18 MED ORDER — GLUCAGON HCL RDNA (DIAGNOSTIC) 1 MG IJ SOLR
INTRAMUSCULAR | Status: DC | PRN
  Administered 2016-04-18: 1 mL via INTRAVENOUS

## 2016-04-18 MED ORDER — SUGAMMADEX SODIUM 200 MG/2ML IV SOLN
INTRAVENOUS | Status: DC | PRN
  Administered 2016-04-18: 200 mg via INTRAVENOUS

## 2016-04-18 MED ORDER — CHLORHEXIDINE GLUCONATE 0.12 % MT SOLN
15.0000 mL | Freq: Two times a day (BID) | OROMUCOSAL | Status: DC
  Administered 2016-04-18 – 2016-04-19 (×2): 15 mL via OROMUCOSAL
  Filled 2016-04-18 (×2): qty 15

## 2016-04-18 MED ORDER — GLUCAGON HCL RDNA (DIAGNOSTIC) 1 MG IJ SOLR
INTRAMUSCULAR | Status: AC
  Filled 2016-04-18: qty 1

## 2016-04-18 MED ORDER — LIDOCAINE 2% (20 MG/ML) 5 ML SYRINGE
INTRAMUSCULAR | Status: DC | PRN
  Administered 2016-04-18: 100 mg via INTRAVENOUS

## 2016-04-18 MED ORDER — SODIUM CHLORIDE 0.9 % IV SOLN
INTRAVENOUS | Status: DC | PRN
  Administered 2016-04-18: 53 mL

## 2016-04-18 NOTE — Anesthesia Procedure Notes (Signed)
Procedure Name: Intubation Date/Time: 04/18/2016 10:07 AM Performed by: Mervyn Gay Pre-anesthesia Checklist: Patient identified, Patient being monitored, Timeout performed, Emergency Drugs available and Suction available Patient Re-evaluated:Patient Re-evaluated prior to inductionOxygen Delivery Method: Circle System Utilized Preoxygenation: Pre-oxygenation with 100% oxygen Intubation Type: IV induction Ventilation: Mask ventilation without difficulty Laryngoscope Size: Miller and 3 Grade View: Grade I Tube type: Oral Tube size: 7.5 mm Number of attempts: 1 Airway Equipment and Method: Stylet Placement Confirmation: ETT inserted through vocal cords under direct vision,  positive ETCO2 and breath sounds checked- equal and bilateral Secured at: 21 cm Tube secured with: Tape Dental Injury: Teeth and Oropharynx as per pre-operative assessment

## 2016-04-18 NOTE — Anesthesia Postprocedure Evaluation (Signed)
Anesthesia Post Note  Patient: Cadience Danh  Procedure(s) Performed: Procedure(s) (LRB): ENDOSCOPIC RETROGRADE CHOLANGIOPANCREATOGRAPHY (ERCP) ([name])  Patient location during evaluation: PACU Anesthesia Type: General Level of consciousness: sedated Pain management: satisfactory to patient Vital Signs Assessment: post-procedure vital signs reviewed and stable Respiratory status: spontaneous breathing Cardiovascular status: stable Anesthetic complications: no    Last Vitals:  Vitals:   04/18/16 1115 04/18/16 1130  BP: 110/71 119/75  Pulse: 96 (!) 104  Resp: (!) 24 20  Temp:  36.8 C    Last Pain:  Vitals:   04/18/16 1130  TempSrc: Oral  PainSc:                  Riccardo Dubin

## 2016-04-18 NOTE — Consult Note (Signed)
[facility] Consult Note  Referring Provider: No ref. provider found Primary Care Physician:  No PCP Per Patient Primary Gastroenterologist:  Dr.  Laurel Dimmer Complaint: Abdominal pain and jaundice HPI: Cheryl Guzman is an 50 y.o. white female  status post laparoscopic cholecystectomy with appendectomy on 8/31 presents with 3 days of epigastric and right upper quadrant abdominal pain and jaundice. She had acalculous cholecystitis. An intraoperative cholangiogram was unable to be performed. CT scan of the abdomen with nonspecific findings. Patient denies any fever. She states her pain feels similar to her presenting pain prior to the cholecystectomy. She does have a history of hepatitis C but her liver function tests were normal preoperatively.  Past Medical History:  Diagnosis Date  . Cancer ([place])   . Hepatitis C     Past Surgical History:  Procedure Laterality Date  . ABDOMINAL HYSTERECTOMY    . APPENDECTOMY    . CHOLECYSTECTOMY [name] 03/11/2016   Procedure: LAPAROSCOPIC CHOLECYSTECTOMY WITH ATTEMPTED INTRAOPERATIVE CHOLANGIOGRAM;  Surgeon: Judeth Horn, MD;  Location: [place];  Service: General;  Laterality: [name];  . LAPAROSCOPIC APPENDECTOMY [name] 03/11/2016   Procedure: APPENDECTOMY LAPAROSCOPIC;  Surgeon: Judeth Horn, MD;  Location: [place];  Service: General;  Laterality: [name];    Medications Prior to Admission  Medication Sig Dispense Refill  . acetaminophen (TYLENOL) 500 MG tablet Take 1,000 mg by mouth every 6 (six) hours as needed for moderate pain.    Marland Kitchen ibuprofen (ADVIL,MOTRIN) 200 MG tablet Take 400 mg by mouth every 8 (eight) hours as needed for moderate pain.    . ranitidine (ZANTAC) 75 MG tablet Take 75 mg by mouth daily as needed for heartburn.    . bisacodyl (DULCOLAX) 5 MG EC tablet Take 1 tablet (5 mg total) by mouth daily as needed for moderate constipation. (Patient not taking: Reported on 04/17/2016) 30 tablet 0  . ciprofloxacin (CIPRO) 500 MG tablet Take 1 tablet  (500 mg total) by mouth 2 (two) times daily. (Patient not taking: Reported on 04/17/2016) 10 tablet 0  . oxyCODONE (OXY IR/ROXICODONE) 5 MG immediate release tablet Take 1-2 tablets (5-10 mg total) by mouth every 4 (four) hours as needed for moderate pain. (Patient not taking: Reported on 04/17/2016) 30 tablet 0    Allergies: No Known Allergies  Family History  Problem Relation Age of Onset  . Cancer Mother   . Diabetes Mother   . Heart disease Mother   . Hyperlipidemia Mother   . Stroke Mother   . Diabetes Father   . Stroke Father     Social History:  reports that she has been smoking.  She has never used smokeless tobacco. Her alcohol and drug histories are not on file.  Review of Systems: negative except as above   Blood pressure 138/74, pulse 90, temperature 99.3 F (37.4 C), temperature source Oral, resp. rate (!) 21, height 5' 4"  (1.626 m), weight 85.1 kg (187 lb 9.6 oz), SpO2 95 %. Head: Normocephalic, without obvious abnormality, atraumatic Neck: no adenopathy, no carotid bruit, no JVD, supple, symmetrical, trachea midline and thyroid not enlarged, symmetric, no tenderness/mass/nodules Resp: clear to auscultation bilaterally Cardio: regular rate and rhythm, S1, S2 normal, no murmur, click, rub or gallop GI: Abdomen soft mildly tender in epigastrium and right upper quadrant Extremities: extremities normal, atraumatic, no cyanosis or edema  Results for orders placed or performed during the hospital encounter of 04/17/16 (from the past 48 hour(s))  Urinalysis, Routine w reflex microscopic     Status: Abnormal   Collection  Time: 04/17/16  4:40 PM  Result Value Ref Range   Color, Urine ORANGE (A) YELLOW    Comment: BIOCHEMICALS MAY BE AFFECTED BY COLOR   APPearance TURBID (A) CLEAR   Specific Gravity, Urine 1.025 1.005 - 1.030   pH 6.5 5.0 - 8.0   Glucose, UA 250 (A) NEGATIVE mg/dL   Hgb urine dipstick SMALL (A) NEGATIVE   Bilirubin Urine LARGE (A) NEGATIVE   Ketones, ur 40  (A) NEGATIVE mg/dL   Protein, ur >300 (A) NEGATIVE mg/dL   Nitrite POSITIVE (A) NEGATIVE   Leukocytes, UA TRACE (A) NEGATIVE  Urine microscopic-add on     Status: Abnormal   Collection Time: 04/17/16  4:40 PM  Result Value Ref Range   Squamous Epithelial / LPF 6-30 (A) NONE SEEN   WBC, UA 0-5 0 - 5 WBC/hpf   RBC / HPF 0-5 0 - 5 RBC/hpf   Bacteria, UA MANY (A) NONE SEEN   Urine-Other AMORPHOUS URATES/PHOSPHATES     Comment: MICROSCOPIC EXAM PERFORMED ON UNCONCENTRATED URINE LESS THAN 10 mL OF URINE SUBMITTED   Lipase, blood     Status: None   Collection Time: 04/17/16  4:49 PM  Result Value Ref Range   Lipase 24 11 - 51 U/L  Comprehensive metabolic panel     Status: Abnormal   Collection Time: 04/17/16  4:49 PM  Result Value Ref Range   Sodium 135 135 - 145 mmol/L   Potassium 3.8 3.5 - 5.1 mmol/L   Chloride 104 101 - 111 mmol/L   CO2 19 (L) 22 - 32 mmol/L   Glucose, Bld 151 (H) 65 - 99 mg/dL   BUN 8 6 - 20 mg/dL   Creatinine, Ser 0.76 0.44 - 1.00 mg/dL   Calcium 9.7 8.9 - 10.3 mg/dL   Total Protein 7.6 6.5 - 8.1 g/dL   Albumin 4.0 3.5 - 5.0 g/dL   AST 443 (H) 15 - 41 U/L   ALT 638 (H) 14 - 54 U/L   Alkaline Phosphatase 313 (H) 38 - 126 U/L   Total Bilirubin 6.0 (H) 0.3 - 1.2 mg/dL   GFR calc non Af Amer >60 >60 mL/min   GFR calc Af Amer >60 >60 mL/min    Comment: (NOTE) The eGFR has been calculated using the CKD EPI equation. This calculation has not been validated in all clinical situations. eGFR's persistently <60 mL/min signify possible Chronic Kidney Disease.    Anion gap 12 5 - 15  CBC     Status: None   Collection Time: 04/17/16  4:49 PM  Result Value Ref Range   WBC 9.4 4.0 - 10.5 K/uL   RBC 4.96 3.87 - 5.11 MIL/uL   Hemoglobin 14.5 12.0 - 15.0 g/dL   HCT 44.2 36.0 - 46.0 %   MCV 89.1 78.0 - 100.0 fL   MCH 29.2 26.0 - 34.0 pg   MCHC 32.8 30.0 - 36.0 g/dL   RDW 14.2 11.5 - 15.5 %   Platelets 238 150 - 400 K/uL  Acetaminophen level     Status: Abnormal    Collection Time: 04/17/16 10:31 PM  Result Value Ref Range   Acetaminophen (Tylenol), Serum <10 (L) 10 - 30 ug/mL    Comment:        THERAPEUTIC CONCENTRATIONS VARY SIGNIFICANTLY. A RANGE OF 10-30 ug/mL MAY BE AN EFFECTIVE CONCENTRATION FOR MANY PATIENTS. HOWEVER, SOME ARE BEST TREATED AT CONCENTRATIONS OUTSIDE THIS RANGE. ACETAMINOPHEN CONCENTRATIONS >150 ug/mL AT 4 HOURS AFTER INGESTION AND >50 ug/mL  AT 12 HOURS AFTER INGESTION ARE OFTEN ASSOCIATED WITH TOXIC REACTIONS.   Lactic acid, plasma     Status: None   Collection Time: 04/17/16 10:31 PM  Result Value Ref Range   Lactic Acid, Venous 0.7 0.5 - 1.9 mmol/L  Bilirubin, direct     Status: Abnormal   Collection Time: 04/17/16 10:31 PM  Result Value Ref Range   Bilirubin, Direct 4.0 (H) 0.1 - 0.5 mg/dL  Protime-INR     Status: None   Collection Time: 04/17/16 10:31 PM  Result Value Ref Range   Prothrombin Time 14.9 11.4 - 15.2 seconds   INR 1.16   Comprehensive metabolic panel     Status: Abnormal   Collection Time: 04/18/16  3:58 AM  Result Value Ref Range   Sodium 134 (L) 135 - 145 mmol/L   Potassium 3.7 3.5 - 5.1 mmol/L   Chloride 105 101 - 111 mmol/L   CO2 18 (L) 22 - 32 mmol/L   Glucose, Bld 102 (H) 65 - 99 mg/dL   BUN 5 (L) 6 - 20 mg/dL   Creatinine, Ser 0.67 0.44 - 1.00 mg/dL   Calcium 8.8 (L) 8.9 - 10.3 mg/dL   Total Protein 6.8 6.5 - 8.1 g/dL   Albumin 3.3 (L) 3.5 - 5.0 g/dL   AST 159 (H) 15 - 41 U/L   ALT 397 (H) 14 - 54 U/L   Alkaline Phosphatase 268 (H) 38 - 126 U/L   Total Bilirubin 5.4 (H) 0.3 - 1.2 mg/dL   GFR calc non Af Amer >60 >60 mL/min   GFR calc Af Amer >60 >60 mL/min    Comment: (NOTE) The eGFR has been calculated using the CKD EPI equation. This calculation has not been validated in all clinical situations. eGFR's persistently <60 mL/min signify possible Chronic Kidney Disease.    Anion gap 11 5 - 15  CBC     Status: Abnormal   Collection Time: 04/18/16  3:58 AM  Result Value Ref  Range   WBC 10.7 (H) 4.0 - 10.5 K/uL   RBC 4.35 3.87 - 5.11 MIL/uL   Hemoglobin 12.8 12.0 - 15.0 g/dL   HCT 38.4 36.0 - 46.0 %   MCV 88.3 78.0 - 100.0 fL   MCH 29.4 26.0 - 34.0 pg   MCHC 33.3 30.0 - 36.0 g/dL   RDW 14.5 11.5 - 15.5 %   Platelets 174 150 - 400 K/uL  Glucose, capillary     Status: Abnormal   Collection Time: 04/18/16  7:34 AM  Result Value Ref Range   Glucose-Capillary 113 (H) 65 - 99 mg/dL   Ct Abdomen Pelvis W Contrast  Result Date: 04/17/2016 CLINICAL DATA:  Diffuse abdominal pain with nausea and vomiting for 2 days, initial encounter EXAM: CT ABDOMEN AND PELVIS WITH CONTRAST TECHNIQUE: Multidetector CT imaging of the abdomen and pelvis was performed using the standard protocol following bolus administration of intravenous contrast. CONTRAST:  100 mL ISOVUE-300 IOPAMIDOL (ISOVUE-300) INJECTION 61% COMPARISON:  None. FINDINGS: Lower chest: Lung bases demonstrate bibasilar atelectatic changes. No sizable effusion is seen. Small hiatal hernia is noted. Hepatobiliary: Diffuse fatty infiltration of the liver is noted. The gallbladder has been surgically removed. Fullness of the biliary tree is noted secondary to the post cholecystectomy state. Some minimal postoperative changes are noted in the gallbladder fossa although no sizable fluid collection is seen. Pancreas: No mass, inflammatory changes, or other significant abnormality. Spleen: Within normal limits in size and appearance. Adrenals/Urinary Tract: A 2.5 cm hypodense  lesion is noted in the left adrenal gland likely representing an adenoma. This is slightly larger than that seen on a prior exam in 2006 but still felt to be likely related to an adenoma. A smaller right adrenal lesion is noted also likely representing an adenoma. Kidneys are within normal limits bilaterally. No renal calculi are seen. Normal excretion is noted on delayed images. The bladder is decompressed. Stomach/Bowel: The appendix has been surgically removed. No  obstructive changes are noted. Vascular/Lymphatic: Scattered aortoiliac calcifications are noted. No significant lymphadenopathy is noted. Reproductive: The uterus has been surgically removed. Other: None. Musculoskeletal:  Degenerative changes of lumbar spine are noted. IMPRESSION: Bilateral adrenal masses consistent with adenomas. Postsurgical changes as described. Mild postsurgical changes in the gallbladder fossa related to the recent cholecystectomy. Bibasilar atelectatic changes without focal confluent infiltrate. Electronically Signed   By: Inez Catalina M.D.   On: 04/17/2016 20:24   US Abdomen Limited Ruq  Result Date: 04/17/2016 CLINICAL DATA:  50 year old female with right upper quadrant abdominal pain. History of hepatitis-C. EXAM: US ABDOMEN LIMITED - RIGHT UPPER QUADRANT COMPARISON:  CT dated 04/17/2016 FINDINGS: Gallbladder: Cholecystectomy. Common bile duct: Diameter: 7 mm Liver: There is diffuse heterogeneous hepatic echotexture with increased echogenicity, likely related to underlying fatty infiltration. IMPRESSION: Fatty liver. Cholecystectomy. Electronically Signed   By: Anner Crete M.D.   On: 04/17/2016 23:34    Assessment: Abdominal pain associated with elevated liver function tests and jaundice, status post cholecystectomy/appendectomy. Evidence for CBD stone equivocal but clinical presentation suggests this, cannot rule out bile leak but none evident on CT. Plan:  I think clinical presentation warrants ERCP irrespective of findings on further imaging studies such as ultrasound or MRCP. Risks rationale and alternatives explained to the patient she wishes to proceed. We'll schedule for later this morning. Karey Suthers C 04/18/2016, 8:42 AM  Pager [phone] If no answer or after 5 PM call [phone]

## 2016-04-18 NOTE — Progress Notes (Signed)
PROGRESS NOTE    Cheryl Guzman  [id number] DOB: Jul 30, 1966 DOA: 04/17/2016 PCP: No PCP Per Patient   Brief Narrative: 50 year old female with history of hepatitis C, illicit reflux, cholecystitis status post recent laparoscopic cholecystectomy and appendectomy presented with nausea vomiting and abdominal pain. Patient is status post ERCP on 9/7 showed possible cholangitis.  Assessment & Plan:   # Abdomen pain, epigastric region and abnormal LFTs: Status post ERCP today by gastroenterologist. As per ERCP report possible was found on biliary tree. Continue IV Zosyn. GI consult appreciated. Patient denied nausea vomiting and pain today. We will restart clear liquid diet and advance as tolerated. Continue current supportive treatment. -trend LFTs  # Hepatitis C; advised outpatient follow up with GI  # GERD (gastroesophageal reflux disease): on pepcid.   # Tobacco abuse: education provided to the patient. Currentlt on nicotine patch.   Plan discussed with the patient. Pt's father at bedside.   DVT prophylaxis:  SCD and early ambulation. No AC because of endoscopy procedure today. Code Status: Full Code  Family Communication: Patient's fathet at bedside with patient's permission Disposition Plan: Likely discharge home tomorrow.    Consultants:  GI Procedures: ERCP done today. Antimicrobials: IV zosyn since 9/6.  Subjective: Patient was seen and examined at bedside. Patient came after endoscopic procedure. Denied nausea vomiting. No abdominal pain. Patient reports feeling comfortable. Denied chest pain, shortness of breath, dizziness, lightheadedness. Patient is eager to try clear liquid diet. Patient's father at bedside.   Objective: Vitals:   04/18/16 1100 04/18/16 1105 04/18/16 1115 04/18/16 1130  BP: 108/64 103/76 110/71 119/75  Pulse: (!) 102 98 96 (!) 104  Resp: (!) 23 (!) 22 (!) 24 20  Temp:    98.3 F (36.8 C)  TempSrc:    Oral  SpO2: 97% 96% 94% 94%  Weight:        Height:        Intake/Output Summary (Last 24 hours) at 04/18/16 1408 Last data filed at 04/18/16 1050  Gross per 24 hour  Intake          1802.08 ml  Output                1 ml  Net          1801.08 ml   Filed Weights   04/17/16 1630 04/18/16 0018  Weight: 85 kg (187 lb 5 oz) 85.1 kg (187 lb 9.6 oz)    Examination:  General exam: Appears calm and comfortable, NAD Respiratory system: Clear to auscultation. Respiratory effort normal. Cardiovascular system: S1 & S2 heard, RRR. No murmurs. Gastrointestinal system: Abdomen is nondistended, soft and nontender. Normal bowel sounds heard. Central nervous system: Alert and oriented. No focal neurological deficits. Extremities: Symmetric 5 x 5 power. Skin: No rashes, lesions or ulcers Psychiatry: Judgement and insight appear normal. Mood & affect appropriate.     Data Reviewed: I have personally reviewed following labs and imaging studies  CBC:  Recent Labs Lab 04/17/16 1649 04/18/16 0358  WBC 9.4 10.7*  HGB 14.5 12.8  HCT 44.2 38.4  MCV 89.1 88.3  PLT 238 [id number]   Basic Metabolic Panel:  Recent Labs Lab 04/17/16 1649 04/18/16 0358  NA 135 134*  K 3.8 3.7  CL 104 105  CO2 19* 18*  GLUCOSE 151* 102*  BUN 8 5*  CREATININE 0.76 0.67  CALCIUM 9.7 8.8*   GFR: Estimated Creatinine Clearance: 87.9 mL/min (by C-G formula based on SCr of 0.8 mg/dL). Liver Function Tests:  Recent Labs Lab 04/17/16 1649 04/18/16 0358  AST 443* 159*  ALT 638* 397*  ALKPHOS 313* 268*  BILITOT 6.0* 5.4*  PROT 7.6 6.8  ALBUMIN 4.0 3.3*    Recent Labs Lab 04/17/16 1649  LIPASE 24   No results for input(s): AMMONIA in the last 168 hours. Coagulation Profile:  Recent Labs Lab 04/17/16 2231  INR 1.16   CBG:  Recent Labs Lab 04/18/16 0734  GLUCAP 113*  Sepsis Labs:  Recent Labs Lab 04/17/16 2231  LATICACIDVEN 0.7    No results found for this or any previous visit (from the past 240 hour(s)).       Radiology  Studies: Ct Abdomen Pelvis W Contrast  Result Date: 04/17/2016 CLINICAL DATA:  Diffuse abdominal pain with nausea and vomiting for 2 days, initial encounter EXAM: CT ABDOMEN AND PELVIS WITH CONTRAST TECHNIQUE: Multidetector CT imaging of the abdomen and pelvis was performed using the standard protocol following bolus administration of intravenous contrast. CONTRAST:  100 mL ISOVUE-300 IOPAMIDOL (ISOVUE-300) INJECTION 61% COMPARISON:  None. FINDINGS: Lower chest: Lung bases demonstrate bibasilar atelectatic changes. No sizable effusion is seen. Small hiatal hernia is noted. Hepatobiliary: Diffuse fatty infiltration of the liver is noted. The gallbladder has been surgically removed. Fullness of the biliary tree is noted secondary to the post cholecystectomy state. Some minimal postoperative changes are noted in the gallbladder fossa although no sizable fluid collection is seen. Pancreas: No mass, inflammatory changes, or other significant abnormality. Spleen: Within normal limits in size and appearance. Adrenals/Urinary Tract: A 2.5 cm hypodense lesion is noted in the left adrenal gland likely representing an adenoma. This is slightly larger than that seen on a prior exam in 2006 but still felt to be likely related to an adenoma. A smaller right adrenal lesion is noted also likely representing an adenoma. Kidneys are within normal limits bilaterally. No renal calculi are seen. Normal excretion is noted on delayed images. The bladder is decompressed. Stomach/Bowel: The appendix has been surgically removed. No obstructive changes are noted. Vascular/Lymphatic: Scattered aortoiliac calcifications are noted. No significant lymphadenopathy is noted. Reproductive: The uterus has been surgically removed. Other: None. Musculoskeletal:  Degenerative changes of lumbar spine are noted. IMPRESSION: Bilateral adrenal masses consistent with adenomas. Postsurgical changes as described. Mild postsurgical changes in the gallbladder  fossa related to the recent cholecystectomy. Bibasilar atelectatic changes without focal confluent infiltrate. Electronically Signed   By: Inez Catalina M.D.   On: 04/17/2016 20:24   Dg Ercp Biliary & Pancreatic Ducts  Result Date: 04/18/2016 CLINICAL DATA:  Common bile duct calculi EXAM: ERCP TECHNIQUE: Multiple spot images obtained with the fluoroscopic device and submitted for interpretation post-procedure. FLUOROSCOPY TIME:  Fluoroscopy Time:  3 minutes 34 seconds COMPARISON:  Ultrasound 04/17/2016 and previous FINDINGS: Series of 4 fluoroscopic spot images document endoscopic cannulation and opacification of the CBD. Incomplete opacification of the intrahepatic biliary tree which appears decompressed centrally. Subsequent images demonstrate balloon catheter passage through the CBD and scope removal. Cholecystectomy clips noted. IMPRESSION: 1. Endoscopic CBD intervention. These images were submitted for radiologic interpretation only. Please see the procedural report for the amount of contrast and the fluoroscopy time utilized. Electronically Signed   By: Lucrezia Europe M.D.   On: 04/18/2016 11:15   US Abdomen Limited Ruq  Result Date: 04/17/2016 CLINICAL DATA:  50 year old female with right upper quadrant abdominal pain. History of hepatitis-C. EXAM: US ABDOMEN LIMITED - RIGHT UPPER QUADRANT COMPARISON:  CT dated 04/17/2016 FINDINGS: Gallbladder: Cholecystectomy. Common bile duct: Diameter: 7  mm Liver: There is diffuse heterogeneous hepatic echotexture with increased echogenicity, likely related to underlying fatty infiltration. IMPRESSION: Fatty liver. Cholecystectomy. Electronically Signed   By: Anner Crete M.D.   On: 04/17/2016 23:34        Scheduled Meds: . chlorhexidine  15 mL Mouth Rinse BID  . famotidine  20 mg Oral Daily  . mouth rinse  15 mL Mouth Rinse q12n4p  . nicotine  21 mg Transdermal Daily  . piperacillin-tazobactam (ZOSYN)  IV  3.375 g Intravenous Q8H  . sodium chloride flush   3 mL Intravenous Q12H   Continuous Infusions: . sodium chloride 125 mL/hr at 04/17/16 2335     LOS: 1 day    Time spent:26    Deleah Tison Tanna Furry, MD [facility] Pager [phone]  If 7PM-7AM, please contact night-coverage [email or web address] Password [email or web address] 04/18/2016, 2:08 PM

## 2016-04-18 NOTE — Transfer of Care (Signed)
Immediate Anesthesia Transfer of Care Note  Patient: Cheryl Guzman  Procedure(s) Performed: Procedure(s): ENDOSCOPIC RETROGRADE CHOLANGIOPANCREATOGRAPHY (ERCP) ([name])  Patient Location: Endoscopy Unit  Anesthesia Type:General  Level of Consciousness: awake, alert , oriented and patient cooperative  Airway & Oxygen Therapy: Patient Spontanous Breathing and Patient connected to face mask oxygen  Post-op Assessment: Report given to RN, Post -op Vital signs reviewed and stable and Patient moving all extremities X 4  Post vital signs: Reviewed and stable  Last Vitals:  Vitals:   04/18/16 0429 04/18/16 0855  BP: 138/74 122/69  Pulse: 90 89  Resp: (!) 21 17  Temp: 37.4 C     Last Pain:  Vitals:   04/18/16 0855  TempSrc: Oral  PainSc:       Patients Stated Pain Goal: 0 ([id number])  Complications: No apparent anesthesia complications

## 2016-04-18 NOTE — Progress Notes (Signed)
New Admission Note:  Arrival Method: Stretcher Mental Orientation: A&O x4 Telemetry: Box 14, CCMD notified Assessment: Completed Skin: Completed with Brett Fairy D, RN, check flowsheets IV: Clean, dry and infusing Pain: 6/10, Patient received pain medication before arriving on unit. Tubes: [name] Safety Measures: Safety Fall Prevention Plan discussed with patient  Admission: Completed [address] Orientation: Patient has been orientated to the room, unit and the staff. Family: None at bedside  Orders have been reviewed and implemented. Will continue to monitor the patient. Call light has been placed within reach.  Nena Polio BSN, RN  Phone Number: [phone]

## 2016-04-18 NOTE — Op Note (Signed)
[facility] Patient Name: Cheryl Guzman Procedure Date : 04/18/2016 MRN: [id number] Attending MD: Missy Sabins , MD Date of Birth: 21-Apr-1966 CSN: [id number] Age: 50 Admit Type: Inpatient Procedure:                ERCP Indications:              Suspected ascending cholangitis Providers:                Elyse Jarvis. Amedeo Plenty, MD, Dortha Schwalbe RN, RN, Alfonso Patten, Technician, Cathe Mons, CRNA Referring MD:              Medicines:                General Anesthesia Complications:            No immediate complications. Estimated Blood Loss:     Estimated blood loss: none. Procedure:                Pre-Anesthesia Assessment:                           - Prior to the procedure, a History and Physical                            was performed, and patient medications and                            allergies were reviewed. The patient's tolerance of                            previous anesthesia was also reviewed. The risks                            and benefits of the procedure and the sedation                            options and risks were discussed with the patient.                            All questions were answered, and informed consent                            was obtained. Prior Anticoagulants: The patient has                            taken no previous anticoagulant or antiplatelet                            agents. ASA Grade Assessment: II - A patient with                            mild systemic disease. After reviewing the risks  and benefits, the patient was deemed in                            satisfactory condition to undergo the procedure.                           After obtaining informed consent, the scope was                            passed under direct vision. Throughout the                            procedure, the patient's blood pressure, pulse, and                            oxygen saturations  were monitored continuously. The                            [id number]) scope was introduced through                            the mouth, and used to inject contrast into and                            used to inject contrast into the bile duct and                            ventral pancreatic duct. The ERCP was accomplished                            without difficulty. The patient tolerated the                            procedure well. Scope In: Scope Out: Findings:      The major papilla was normal. A wire was passed into the biliary tree.       The bile duct was then deeply cannulated over the guidewire. Contrast       was injected. I personally interpreted the bile duct and pancreatic duct       images. Ductal flow of contrast was adequate. Image quality was       excellent. Contrast extended to the entire biliary tree. A       cholecystectomy had been performed. The main bile duct was mildly       dilated. The largest diameter was 10 mm. The main bile duct contained no       stones. A 10 mm biliary sphincterotomy was made with a traction       (standard) sphincterotome. There was no post-sphincterotomy bleeding. To       discover objects, the biliary tree was swept with a 15 mm balloon       starting at the upper third of the main bile duct. Pus was swept from       the duct. Impression:               - The major papilla appeared normal.                           -  The entire main bile duct was mildly dilated.                           - The patient has had a cholecystectomy.                           - A biliary sphincterotomy was performed.                           - The biliary tree was swept and pus was found. Recommendation:           - Use broad spectrum antibiotics. Procedure Code(s):        --- Professional ---                           [id number], Endoscopic retrograde                            cholangiopancreatography (ERCP); with                             sphincterotomy/papillotomy                           [id number], Combined endoscopic catheterization of the                            biliary and pancreatic ductal systems, radiological                            supervision and interpretation Diagnosis Code(s):        --- Professional ---                           Z90.49, Acquired absence of other specified parts                            of digestive tract                           K83.8, Other specified diseases of biliary tract CPT copyright 2016 [employer]. All rights reserved. The codes documented in this report are preliminary and upon coder review may  be revised to meet current compliance requirements. Missy Sabins, MD 04/18/2016 10:56:05 AM This report has been signed electronically. Number of Addenda: 0

## 2016-04-18 NOTE — Anesthesia Preprocedure Evaluation (Signed)
Anesthesia Evaluation  Patient identified by MRN, date of birth, ID band Patient awake    Reviewed: Allergy & Precautions, NPO status , Patient's Chart, lab work & pertinent test results  Airway Mallampati: I  TM Distance: >3 FB Neck ROM: full    Dental  (+) Teeth Intact, Dental Advidsory Given   Pulmonary Current Smoker,    Pulmonary exam normal        Cardiovascular Normal cardiovascular exam     Neuro/Psych    GI/Hepatic (+) Hepatitis -, C  Endo/Other    Renal/GU      Musculoskeletal   Abdominal   Peds  Hematology   Anesthesia Other Findings   Reproductive/Obstetrics                             Anesthesia Physical  Anesthesia Plan  ASA: II  Anesthesia Plan: General   Post-op Pain Management:    Induction: Intravenous  Airway Management Planned: Oral ETT  Additional Equipment:   Intra-op Plan:   Post-operative Plan: Extubation in OR  Informed Consent: I have reviewed the patients History and Physical, chart, labs and discussed the procedure including the risks, benefits and alternatives for the proposed anesthesia with the patient or authorized representative who has indicated his/her understanding and acceptance.   Dental Advisory Given  Plan Discussed with: CRNA, Anesthesiologist and Surgeon  Anesthesia Plan Comments:         Anesthesia Quick Evaluation                                   Anesthesia Evaluation  Patient identified by MRN, date of birth, ID band Patient awake    Reviewed: Allergy & Precautions, NPO status , Patient's Chart, lab work & pertinent test results  Airway Mallampati: I  TM Distance: >3 FB Neck ROM: full    Dental  (+) Teeth Intact, Dental Advidsory Given   Pulmonary Current Smoker,    Pulmonary exam normal        Cardiovascular Normal cardiovascular exam     Neuro/Psych    GI/Hepatic (+) Hepatitis -, C   Endo/Other    Renal/GU      Musculoskeletal   Abdominal   Peds  Hematology   Anesthesia Other Findings   Reproductive/Obstetrics                            Anesthesia Physical Anesthesia Plan  ASA: II  Anesthesia Plan: General   Post-op Pain Management:    Induction: Intravenous  Airway Management Planned: Oral ETT  Additional Equipment:   Intra-op Plan:   Post-operative Plan: Extubation in OR  Informed Consent: I have reviewed the patients History and Physical, chart, labs and discussed the procedure including the risks, benefits and alternatives for the proposed anesthesia with the patient or authorized representative who has indicated his/her understanding and acceptance.   Dental Advisory Given  Plan Discussed with: CRNA, Anesthesiologist and Surgeon  Anesthesia Plan Comments:        Anesthesia Quick Evaluation

## 2016-04-19 LAB — HEPATITIS PANEL, ACUTE
HEP A IGM: NEGATIVE
HEP B C IGM: NEGATIVE
Hepatitis B Surface Ag: NEGATIVE

## 2016-04-19 LAB — CBC WITH DIFFERENTIAL/PLATELET
BASOS ABS: 0 10*3/uL (ref 0.0–0.1)
Basophils Relative: 0 %
EOS ABS: 0 10*3/uL (ref 0.0–0.7)
Eosinophils Relative: 0 %
HCT: 37.5 % (ref 36.0–46.0)
HEMOGLOBIN: 12.1 g/dL (ref 12.0–15.0)
LYMPHS ABS: 1.5 10*3/uL (ref 0.7–4.0)
Lymphocytes Relative: 20 %
MCH: 29.1 pg (ref 26.0–34.0)
MCHC: 32.3 g/dL (ref 30.0–36.0)
MCV: 90.1 fL (ref 78.0–100.0)
MONOS PCT: 6 %
Monocytes Absolute: 0.4 10*3/uL (ref 0.1–1.0)
Neutro Abs: 5.4 10*3/uL (ref 1.7–7.7)
Neutrophils Relative %: 74 %
PLATELETS: 186 10*3/uL (ref 150–400)
RBC: 4.16 MIL/uL (ref 3.87–5.11)
RDW: 14.9 % (ref 11.5–15.5)
WBC: 7.2 10*3/uL (ref 4.0–10.5)

## 2016-04-19 LAB — COMPREHENSIVE METABOLIC PANEL
ALK PHOS: 252 U/L — AB (ref 38–126)
ALT: 271 U/L — AB (ref 14–54)
AST: 90 U/L — AB (ref 15–41)
Albumin: 3.1 g/dL — ABNORMAL LOW (ref 3.5–5.0)
Anion gap: 11 (ref 5–15)
BUN: 6 mg/dL (ref 6–20)
CALCIUM: 9.1 mg/dL (ref 8.9–10.3)
CHLORIDE: 104 mmol/L (ref 101–111)
CO2: 22 mmol/L (ref 22–32)
CREATININE: 0.7 mg/dL (ref 0.44–1.00)
GFR calc non Af Amer: >60 mL/min (ref >60)
Glucose, Bld: 114 mg/dL — ABNORMAL HIGH (ref 65–99)
Potassium: 3.6 mmol/L (ref 3.5–5.1)
SODIUM: 137 mmol/L (ref 135–145)
Total Bilirubin: 2.7 mg/dL — ABNORMAL HIGH (ref 0.3–1.2)
Total Protein: 6.6 g/dL (ref 6.5–8.1)

## 2016-04-19 LAB — GLUCOSE, CAPILLARY: Glucose-Capillary: 104 mg/dL — ABNORMAL HIGH (ref 65–99)

## 2016-04-19 MED ORDER — CIPROFLOXACIN HCL 500 MG PO TABS: 500.0000 mg | ORAL_TABLET | Freq: Two times a day (BID) | ORAL | 0 refills | Status: AC

## 2016-04-19 NOTE — Discharge Summary (Signed)
Physician Discharge Summary  Cheryl Guzman [id number] DOB: [birth year] DOA: 04/17/2016  PCP: No PCP Per Patient  Admit date: 04/17/2016 Discharge date: 04/19/2016  Admitted From: Home Disposition: Home  Recommendations for Outpatient Follow-up:  1. Follow up with PCP in 1-2 weeks  Home Health: No Equipment/Devices: None  Discharge Condition: Stable CODE STATUS: Full Diet recommendation: Heart healthy  Brief/Interim Summary: 50 year old female with history of hepatitis C untreated as per medical record, acid reflux, cholecystitis status post recent laparoscopic cholecystectomy and appendectomy presented with nausea vomiting and abdominal pain. Patient was found to have transaminitis. CT scan and ultrasound of abdomen unremarkable except for fatty liver and postsurgical changes. Patient was evaluated by gastroenterologist and underwent ERCP. The ERCP showed dilated main bile duct and pus was found as per ERCP report. The patient was treated for cholangitis with IV Zosyn in the hospital. After the procedure patient clinically improved. Today, see denied fever, chills, nausea, vomiting, abdomen pain. She is able to tolerate diet well. I discussed with Dr. Amedeo Plenty from GI recommended patient can go home with oral antibiotics. The prescription for ciprofloxacin was given for a week. Discharge instructions are discussed with the patient. I discussed with the patient regarding following up with her PCP and Gi.  Patient is discharged home in stable condition.   Discharge Diagnoses:  Principal Problem:   Abdominal pain Active Problems:   Hepatitis C   Abnormal LFTs   GERD (gastroesophageal reflux disease)   Tobacco dependence   Cholangitis    Discharge Instructions  Discharge Instructions    Call MD for:  persistant nausea and vomiting    Complete by:  As directed   Diet - low sodium heart healthy    Complete by:  As directed   Discharge instructions    Complete by:  As directed   Please follow up with your PCP and GI. Also continue your follow up appointments from prior hospital admission. If your symptoms worsen please either contact your doctor or come to ER.   Increase activity slowly    Complete by:  As directed       Medication List    STOP taking these medications   oxyCODONE 5 MG immediate release tablet Commonly known as:  Oxy IR/ROXICODONE     TAKE these medications   acetaminophen 500 MG tablet Commonly known as:  TYLENOL Take 1,000 mg by mouth every 6 (six) hours as needed for moderate pain.   bisacodyl 5 MG EC tablet Commonly known as:  DULCOLAX Take 1 tablet (5 mg total) by mouth daily as needed for moderate constipation.   ciprofloxacin 500 MG tablet Commonly known as:  CIPRO Take 1 tablet (500 mg total) by mouth 2 (two) times daily.   ibuprofen 200 MG tablet Commonly known as:  ADVIL,MOTRIN Take 400 mg by mouth every 8 (eight) hours as needed for moderate pain.   ranitidine 75 MG tablet Commonly known as:  ZANTAC Take 75 mg by mouth daily as needed for heartburn.      Follow-up Information    HAYES,JOHN C, MD. Schedule an appointment as soon as possible for a visit in 2 week(s).   Specialty:  Gastroenterology Contact information: [id number] [place] [address] [phone]          No Known Allergies  Consultations:  GI   Procedures/Studies: Ct Abdomen Pelvis W Contrast  Result Date: 04/17/2016 CLINICAL DATA:  Diffuse abdominal pain with nausea and vomiting for 2 days, initial encounter EXAM:  CT ABDOMEN AND PELVIS WITH CONTRAST TECHNIQUE: Multidetector CT imaging of the abdomen and pelvis was performed using the standard protocol following bolus administration of intravenous contrast. CONTRAST:  100 mL ISOVUE-300 IOPAMIDOL (ISOVUE-300) INJECTION 61% COMPARISON:  None. FINDINGS: Lower chest: Lung bases demonstrate bibasilar atelectatic changes. No sizable effusion is seen. Small hiatal hernia is  noted. Hepatobiliary: Diffuse fatty infiltration of the liver is noted. The gallbladder has been surgically removed. Fullness of the biliary tree is noted secondary to the post cholecystectomy state. Some minimal postoperative changes are noted in the gallbladder fossa although no sizable fluid collection is seen. Pancreas: No mass, inflammatory changes, or other significant abnormality. Spleen: Within normal limits in size and appearance. Adrenals/Urinary Tract: A 2.5 cm hypodense lesion is noted in the left adrenal gland likely representing an adenoma. This is slightly larger than that seen on a prior exam in 2006 but still felt to be likely related to an adenoma. A smaller right adrenal lesion is noted also likely representing an adenoma. Kidneys are within normal limits bilaterally. No renal calculi are seen. Normal excretion is noted on delayed images. The bladder is decompressed. Stomach/Bowel: The appendix has been surgically removed. No obstructive changes are noted. Vascular/Lymphatic: Scattered aortoiliac calcifications are noted. No significant lymphadenopathy is noted. Reproductive: The uterus has been surgically removed. Other: None. Musculoskeletal:  Degenerative changes of lumbar spine are noted. IMPRESSION: Bilateral adrenal masses consistent with adenomas. Postsurgical changes as described. Mild postsurgical changes in the gallbladder fossa related to the recent cholecystectomy. Bibasilar atelectatic changes without focal confluent infiltrate. Electronically Signed   By: Inez Catalina M.D.   On: 04/17/2016 20:24   Dg Ercp Biliary & Pancreatic Ducts  Result Date: 04/18/2016 CLINICAL DATA:  Common bile duct calculi EXAM: ERCP TECHNIQUE: Multiple spot images obtained with the fluoroscopic device and submitted for interpretation post-procedure. FLUOROSCOPY TIME:  Fluoroscopy Time:  3 minutes 34 seconds COMPARISON:  Ultrasound 04/17/2016 and previous FINDINGS: Series of 4 fluoroscopic spot images  document endoscopic cannulation and opacification of the CBD. Incomplete opacification of the intrahepatic biliary tree which appears decompressed centrally. Subsequent images demonstrate balloon catheter passage through the CBD and scope removal. Cholecystectomy clips noted. IMPRESSION: 1. Endoscopic CBD intervention. These images were submitted for radiologic interpretation only. Please see the procedural report for the amount of contrast and the fluoroscopy time utilized. Electronically Signed   By: Lucrezia Europe M.D.   On: 04/18/2016 11:15   US Abdomen Limited Ruq  Result Date: 04/17/2016 CLINICAL DATA:  50 year old female with right upper quadrant abdominal pain. History of hepatitis-C. EXAM: US ABDOMEN LIMITED - RIGHT UPPER QUADRANT COMPARISON:  CT dated 04/17/2016 FINDINGS: Gallbladder: Cholecystectomy. Common bile duct: Diameter: 7 mm Liver: There is diffuse heterogeneous hepatic echotexture with increased echogenicity, likely related to underlying fatty infiltration. IMPRESSION: Fatty liver. Cholecystectomy. Electronically Signed   By: Anner Crete M.D.   On: 04/17/2016 23:34      Subjective:   Discharge Exam: Vitals:   04/19/16 0621 04/19/16 0956  BP: 128/70 118/80  Pulse: (!) 58 74  Resp: 18 18  Temp: 97.8 F (36.6 C) 97.4 F (36.3 C)   Vitals:   04/18/16 1720 04/18/16 2051 04/19/16 0621 04/19/16 0956  BP: 118/68 (!) 115/91 128/70 118/80  Pulse: 72 76 (!) 58 74  Resp: 18 18 18 18   Temp: 97.9 F (36.6 C) 97.6 F (36.4 C) 97.8 F (36.6 C) 97.4 F (36.3 C)  TempSrc: Oral Oral Oral Oral  SpO2: 98% 99% 100% 100%  Weight:  85.6 kg (188 lb 11.4 oz)    Height:       NAD, comfortable. Eating breakfast. General: Pt is alert, awake, not in acute distress Cardiovascular: RRR, S1/S2 +, no rubs, no gallops Respiratory: CTA bilaterally, no wheezing, no rhonchi Abdominal: Soft, NT, ND, bowel sounds + Extremities: no edema, no cyanosis    The results of significant diagnostics  from this hospitalization (including imaging, microbiology, ancillary and laboratory) are listed below for reference.    Labs: BNP (last 3 results) No results for input(s): BNP in the last 8760 hours. Basic Metabolic Panel:  Recent Labs Lab 04/17/16 1649 04/18/16 0358 04/19/16 0453  NA 135 134* 137  K 3.8 3.7 3.6  CL 104 105 104  CO2 19* 18* 22  GLUCOSE 151* 102* 114*  BUN 8 5* 6  CREATININE 0.76 0.67 0.70  CALCIUM 9.7 8.8* 9.1   Liver Function Tests:  Recent Labs Lab 04/17/16 1649 04/18/16 0358 04/19/16 0453  AST 443* 159* 90*  ALT 638* 397* 271*  ALKPHOS 313* 268* 252*  BILITOT 6.0* 5.4* 2.7*  PROT 7.6 6.8 6.6  ALBUMIN 4.0 3.3* 3.1*    Recent Labs Lab 04/17/16 1649  LIPASE 24   No results for input(s): AMMONIA in the last 168 hours. CBC:  Recent Labs Lab 04/17/16 1649 04/18/16 0358 04/19/16 0453  WBC 9.4 10.7* 7.2  NEUTROABS  --   --  5.4  HGB 14.5 12.8 12.1  HCT 44.2 38.4 37.5  MCV 89.1 88.3 90.1  PLT 238 174 186   Cardiac Enzymes: No results for input(s): CKTOTAL, CKMB, CKMBINDEX, TROPONINI in the last 168 hours. BNP: Invalid input(s): POCBNP CBG:  Recent Labs Lab 04/18/16 0734 04/19/16 0812  GLUCAP 113* 104*   Time coordinating discharge: Around 27 minutes  SIGNED:   Rosita Fire, MD  [facility] 04/19/2016, 10:28 AM  If 7PM-7AM, please contact night-coverage [email or web address] Password [email or web address]

## 2016-04-19 NOTE — Discharge Instructions (Signed)
Please follow up with your PCP and Gi. Also continue your follow up appointments from prior hospital admission. If your symptoms worsen please either contact your doctor or come to ER.

## 2016-04-19 NOTE — Progress Notes (Signed)
[facility] Progress Note  Subjective: Feels great.  Objective: Vital signs in last 24 hours: Temp:  [97.6 F (36.4 C)-98.3 F (36.8 C)] 97.8 F (36.6 C) (09/08 0621) Pulse Rate:  [58-104] 58 (09/08 0621) Resp:  [18-24] 18 (09/08 0621) BP: (103-128)/(64-91) 128/70 (09/08 0621) SpO2:  [94 %-100 %] 100 % (09/08 0621) Weight:  [85.6 kg (188 lb 11.4 oz)] 85.6 kg (188 lb 11.4 oz) (09/07 2051) Weight change: 0.636 kg (1 lb 6.4 oz)   PE: Abdomen non-tender  Lab Results: Results for orders placed or performed during the hospital encounter of 04/17/16 (from the past 24 hour(s))  CBC with Differential/Platelet     Status: None   Collection Time: 04/19/16  4:53 AM  Result Value Ref Range   WBC 7.2 4.0 - 10.5 K/uL   RBC 4.16 3.87 - 5.11 MIL/uL   Hemoglobin 12.1 12.0 - 15.0 g/dL   HCT 37.5 36.0 - 46.0 %   MCV 90.1 78.0 - 100.0 fL   MCH 29.1 26.0 - 34.0 pg   MCHC 32.3 30.0 - 36.0 g/dL   RDW 14.9 11.5 - 15.5 %   Platelets 186 150 - 400 K/uL   Neutrophils Relative % 74 %   Neutro Abs 5.4 1.7 - 7.7 K/uL   Lymphocytes Relative 20 %   Lymphs Abs 1.5 0.7 - 4.0 K/uL   Monocytes Relative 6 %   Monocytes Absolute 0.4 0.1 - 1.0 K/uL   Eosinophils Relative 0 %   Eosinophils Absolute 0.0 0.0 - 0.7 K/uL   Basophils Relative 0 %   Basophils Absolute 0.0 0.0 - 0.1 K/uL  Comprehensive metabolic panel     Status: Abnormal   Collection Time: 04/19/16  4:53 AM  Result Value Ref Range   Sodium 137 135 - 145 mmol/L   Potassium 3.6 3.5 - 5.1 mmol/L   Chloride 104 101 - 111 mmol/L   CO2 22 22 - 32 mmol/L   Glucose, Bld 114 (H) 65 - 99 mg/dL   BUN 6 6 - 20 mg/dL   Creatinine, Ser 0.70 0.44 - 1.00 mg/dL   Calcium 9.1 8.9 - 10.3 mg/dL   Total Protein 6.6 6.5 - 8.1 g/dL   Albumin 3.1 (L) 3.5 - 5.0 g/dL   AST 90 (H) 15 - 41 U/L   ALT 271 (H) 14 - 54 U/L   Alkaline Phosphatase 252 (H) 38 - 126 U/L   Total Bilirubin 2.7 (H) 0.3 - 1.2 mg/dL   GFR calc non Af Amer >60 >60 mL/min   GFR  calc Af Amer >60 >60 mL/min   Anion gap 11 5 - 15  Glucose, capillary     Status: Abnormal   Collection Time: 04/19/16  8:12 AM  Result Value Ref Range   Glucose-Capillary 104 (H) 65 - 99 mg/dL    Studies/Results: Ct Abdomen Pelvis W Contrast  Result Date: 04/17/2016 CLINICAL DATA:  Diffuse abdominal pain with nausea and vomiting for 2 days, initial encounter EXAM: CT ABDOMEN AND PELVIS WITH CONTRAST TECHNIQUE: Multidetector CT imaging of the abdomen and pelvis was performed using the standard protocol following bolus administration of intravenous contrast. CONTRAST:  100 mL ISOVUE-300 IOPAMIDOL (ISOVUE-300) INJECTION 61% COMPARISON:  None. FINDINGS: Lower chest: Lung bases demonstrate bibasilar atelectatic changes. No sizable effusion is seen. Small hiatal hernia is noted. Hepatobiliary: Diffuse fatty infiltration of the liver is noted. The gallbladder has been surgically removed. Fullness of the biliary tree is noted secondary to the post cholecystectomy state. Some minimal  postoperative changes are noted in the gallbladder fossa although no sizable fluid collection is seen. Pancreas: No mass, inflammatory changes, or other significant abnormality. Spleen: Within normal limits in size and appearance. Adrenals/Urinary Tract: A 2.5 cm hypodense lesion is noted in the left adrenal gland likely representing an adenoma. This is slightly larger than that seen on a prior exam in 2006 but still felt to be likely related to an adenoma. A smaller right adrenal lesion is noted also likely representing an adenoma. Kidneys are within normal limits bilaterally. No renal calculi are seen. Normal excretion is noted on delayed images. The bladder is decompressed. Stomach/Bowel: The appendix has been surgically removed. No obstructive changes are noted. Vascular/Lymphatic: Scattered aortoiliac calcifications are noted. No significant lymphadenopathy is noted. Reproductive: The uterus has been surgically removed. Other:  None. Musculoskeletal:  Degenerative changes of lumbar spine are noted. IMPRESSION: Bilateral adrenal masses consistent with adenomas. Postsurgical changes as described. Mild postsurgical changes in the gallbladder fossa related to the recent cholecystectomy. Bibasilar atelectatic changes without focal confluent infiltrate. Electronically Signed   By: Inez Catalina M.D.   On: 04/17/2016 20:24   Dg Ercp Biliary & Pancreatic Ducts  Result Date: 04/18/2016 CLINICAL DATA:  Common bile duct calculi EXAM: ERCP TECHNIQUE: Multiple spot images obtained with the fluoroscopic device and submitted for interpretation post-procedure. FLUOROSCOPY TIME:  Fluoroscopy Time:  3 minutes 34 seconds COMPARISON:  Ultrasound 04/17/2016 and previous FINDINGS: Series of 4 fluoroscopic spot images document endoscopic cannulation and opacification of the CBD. Incomplete opacification of the intrahepatic biliary tree which appears decompressed centrally. Subsequent images demonstrate balloon catheter passage through the CBD and scope removal. Cholecystectomy clips noted. IMPRESSION: 1. Endoscopic CBD intervention. These images were submitted for radiologic interpretation only. Please see the procedural report for the amount of contrast and the fluoroscopy time utilized. Electronically Signed   By: Lucrezia Europe M.D.   On: 04/18/2016 11:15   US Abdomen Limited Ruq  Result Date: 04/17/2016 CLINICAL DATA:  50 year old female with right upper quadrant abdominal pain. History of hepatitis-C. EXAM: US ABDOMEN LIMITED - RIGHT UPPER QUADRANT COMPARISON:  CT dated 04/17/2016 FINDINGS: Gallbladder: Cholecystectomy. Common bile duct: Diameter: 7 mm Liver: There is diffuse heterogeneous hepatic echotexture with increased echogenicity, likely related to underlying fatty infiltration. IMPRESSION: Fatty liver. Cholecystectomy. Electronically Signed   By: Anner Crete M.D.   On: 04/17/2016 23:34      Assessment: Cholangitis, status post ERCP and  sphincterotomy  Plan: Okay for discharge. Recommend broad-spectrum antibody for one week. Follow-up with me in 2 or 3 weeks.    Maysel Mccolm C 04/19/2016, 9:16 AM  Pager [phone] If no answer or after 5 PM call [phone]

## 2016-04-19 NOTE — Progress Notes (Signed)
Pt discharged home via private vehicle. Discharge instructions given and all questions answered.

## 2017-04-07 IMAGING — CT CT ABD-PELV W/ CM
2 of 5 series · 16 of 46 positions shown, 18 images · IV contrast (iopamidol)
Comparison: None.

CLINICAL DATA: Diffuse abdominal pain with nausea and vomiting for
2 days, initial encounter

EXAM:
CT ABDOMEN AND PELVIS WITH CONTRAST
TECHNIQUE: Multidetector CT imaging of the abdomen and pelvis was performed
using the standard protocol following bolus administration of
intravenous contrast.
CONTRAST:  100 mL 1QHCJV-MJJ IOPAMIDOL (1QHCJV-MJJ) INJECTION 61%

[Series 2: abd/ pelvis 5.0 i30f 1 · axial · 0.75mm/px · z∈[+673,+1103]mm · 13 of 98 slices shown, 15 images]
[im 6/98  soft-tissue]
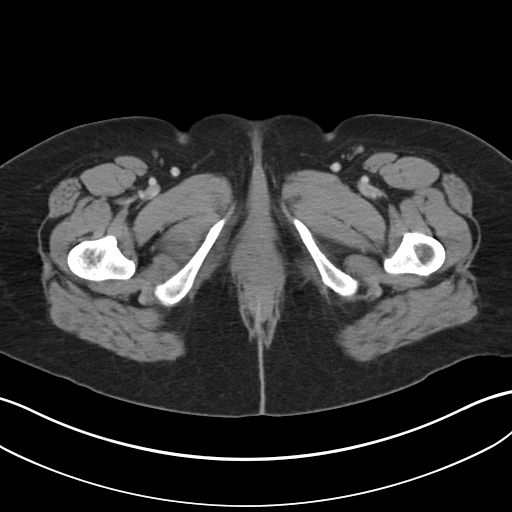
[im 6/98  bone]
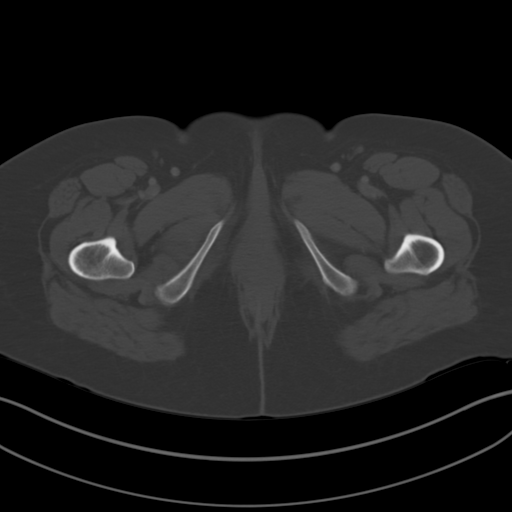
[im 11/98  soft-tissue]
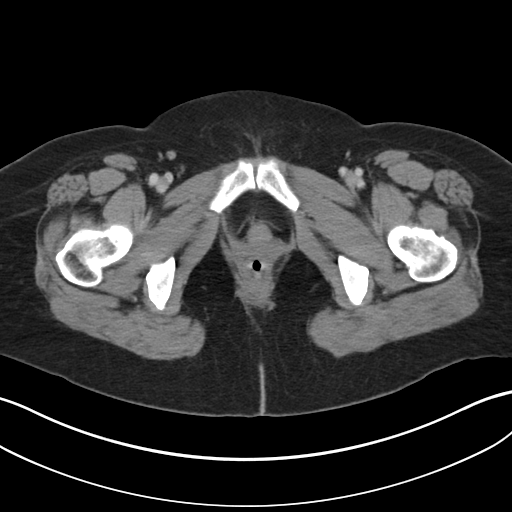
[im 22/98  soft-tissue]
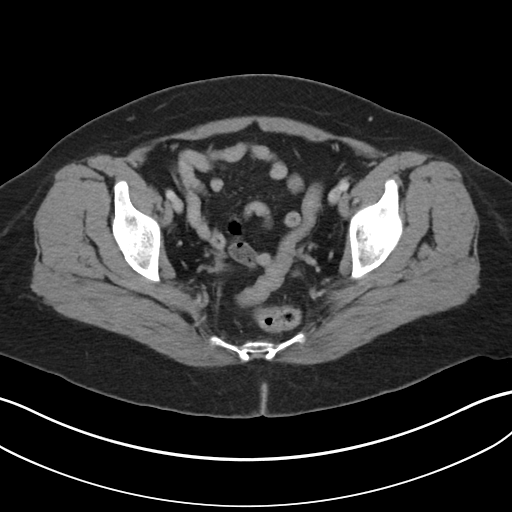
[im 27/98  soft-tissue]
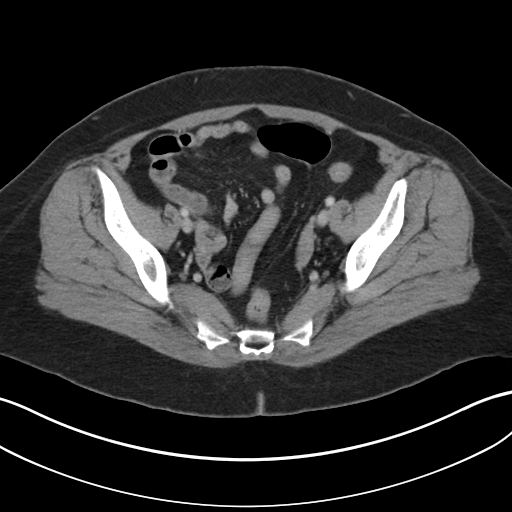
[im 33/98  soft-tissue]
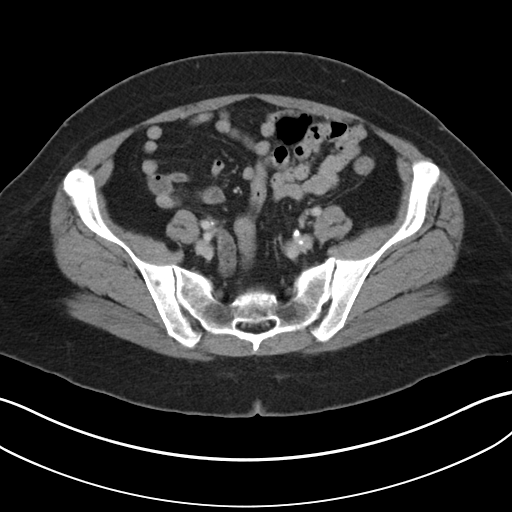
[im 44/98  soft-tissue]
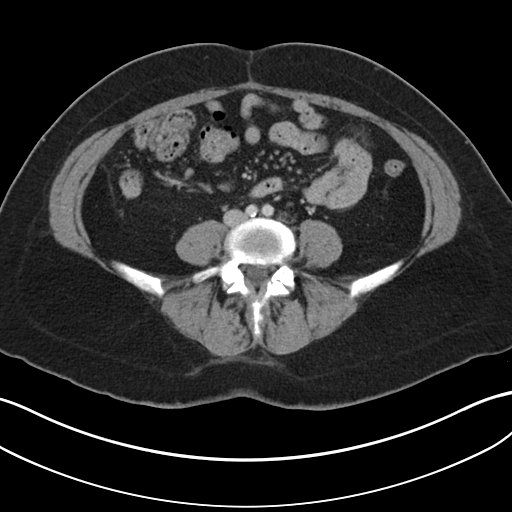
[im 49/98  soft-tissue]
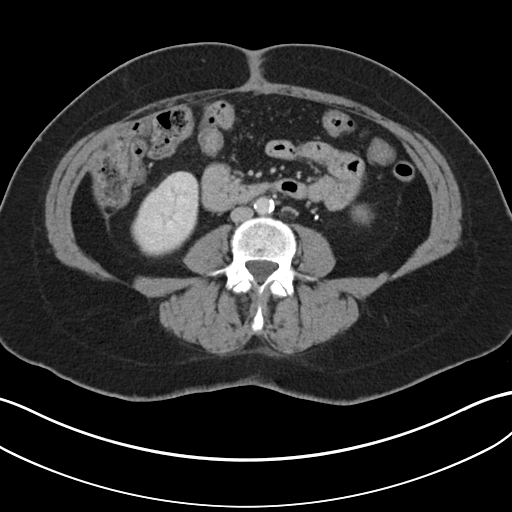
[im 54/98  soft-tissue]
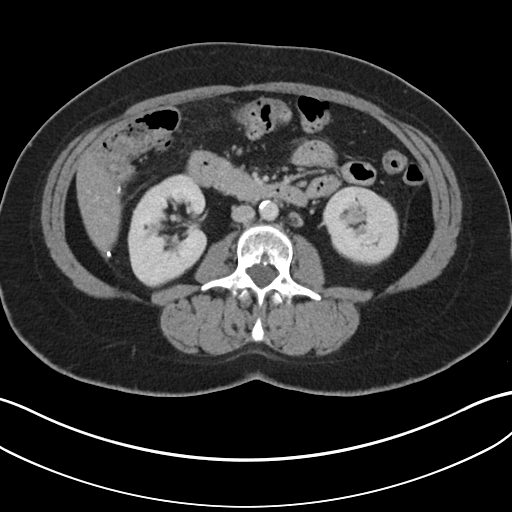
[im 65/98  soft-tissue]
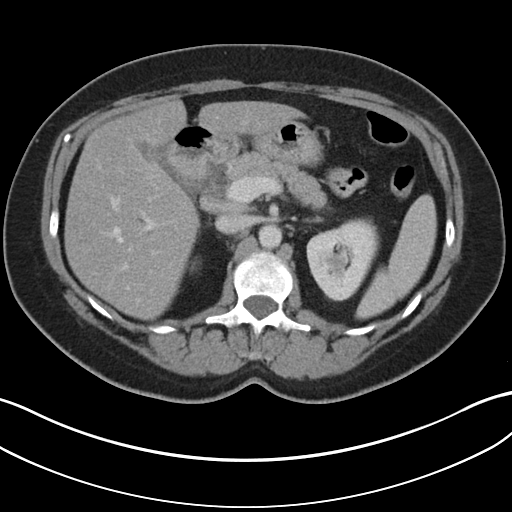
[im 65/98  bone]
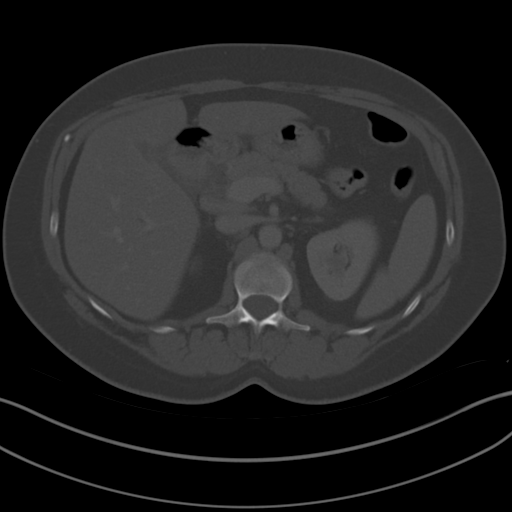
[im 71/98  soft-tissue]
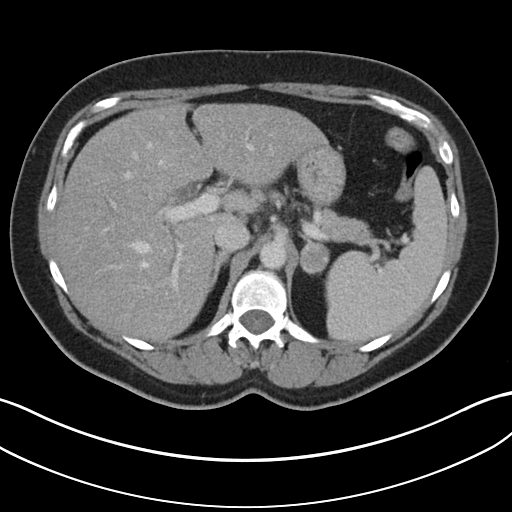
[im 76/98  soft-tissue]
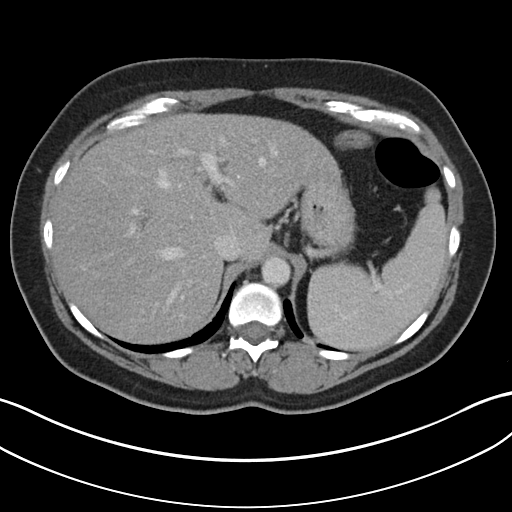
[im 87/98  soft-tissue]
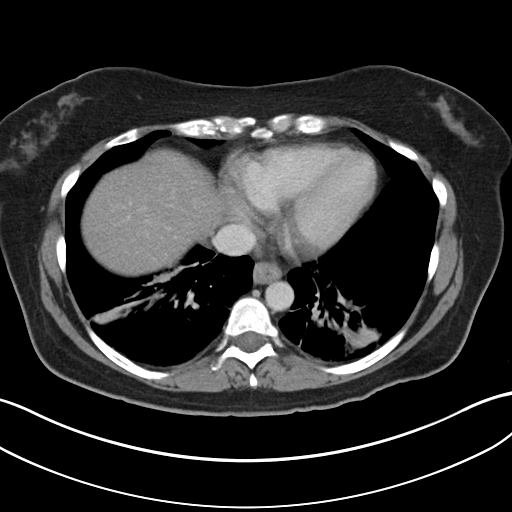
[im 92/98  soft-tissue]
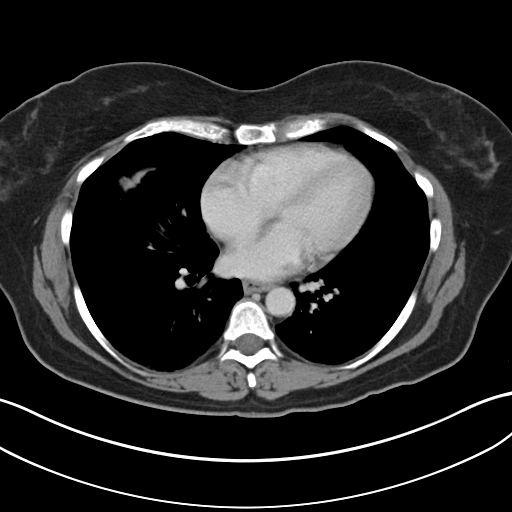

[Series 5: coronal soft tissue · coronal · 0.80mm/px · 3 of 86 slices shown]
[im 29/86  soft-tissue]
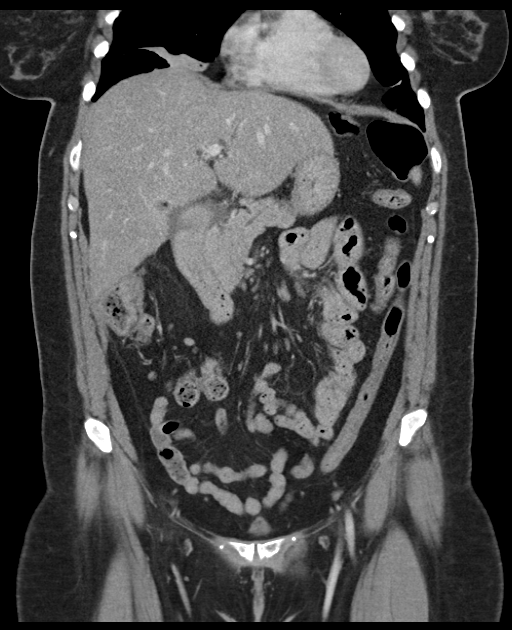
[im 38/86  soft-tissue]
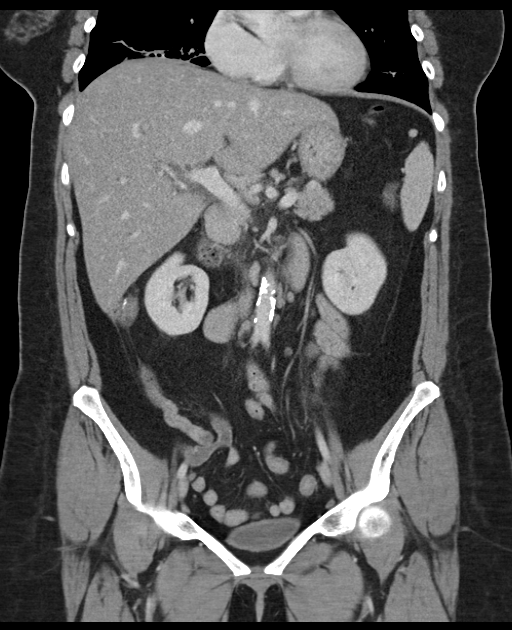
[im 48/86  soft-tissue]
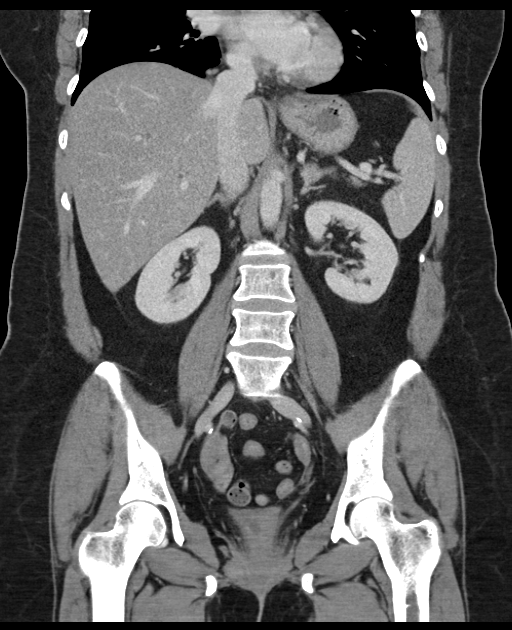

[16 of 46 positions shown; findings below may reference images not displayed]

FINDINGS: Lower chest: Lung bases demonstrate bibasilar atelectatic changes.
No sizable effusion is seen. Small hiatal hernia is noted.

Hepatobiliary: Diffuse fatty infiltration of the liver is noted. The
gallbladder has been surgically removed. Fullness of the biliary
tree is noted secondary to the post cholecystectomy state. Some
minimal postoperative changes are noted in the gallbladder fossa
although no sizable fluid collection is seen.

Pancreas: No mass, inflammatory changes, or other significant
abnormality.

Spleen: Within normal limits in size and appearance.

Adrenals/Urinary Tract: A 2.5 cm hypodense lesion is noted in the
left adrenal gland likely representing an adenoma. This is slightly
larger than that seen on a prior exam in 4556 but still felt to be
likely related to an adenoma. A smaller right adrenal lesion is
noted also likely representing an adenoma. Kidneys are within normal
limits bilaterally. No renal calculi are seen. Normal excretion is
noted on delayed images. The bladder is decompressed.

Stomach/Bowel: The appendix has been surgically removed. No
obstructive changes are noted.

Vascular/Lymphatic: Scattered aortoiliac calcifications are noted.
No significant lymphadenopathy is noted.

Reproductive: The uterus has been surgically removed.

Other: None.

Musculoskeletal:  Degenerative changes of lumbar spine are noted.
IMPRESSION: Bilateral adrenal masses consistent with adenomas.

Postsurgical changes as described.

Mild postsurgical changes in the gallbladder fossa related to the
recent cholecystectomy.

Bibasilar atelectatic changes without focal confluent infiltrate.

## 2017-05-20 IMAGING — US US ABDOMEN COMPLETE
1 series · 13 of 25 positions shown · non-contrast
Comparison: CT 02/27/2005

CLINICAL DATA: Right upper quadrant pain, right flank pain.
Positive Murphy's sign.

EXAM:
ABDOMEN ULTRASOUND COMPLETE

[Series 1: us abdomen complete · 0.24mm/px · 13 of 101 slices shown]
[im 1/101]
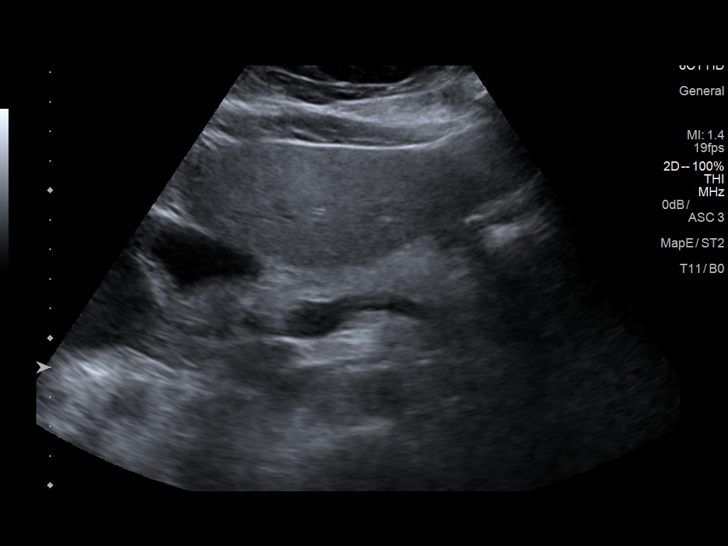
[im 9/101]
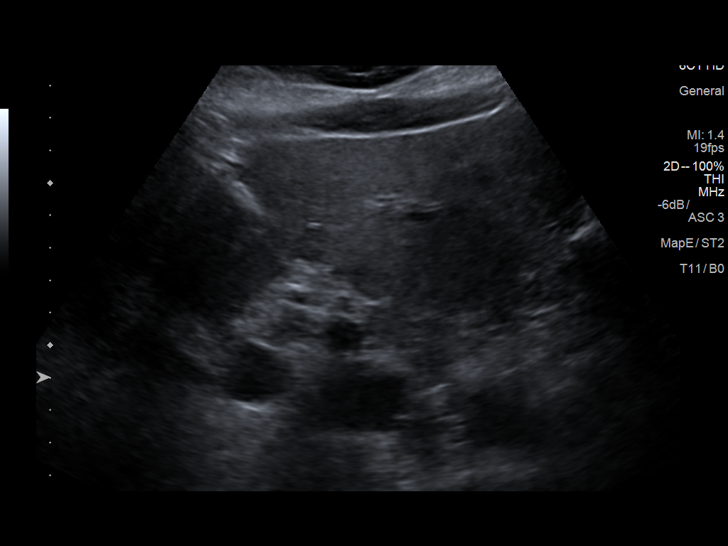
[im 17/101]
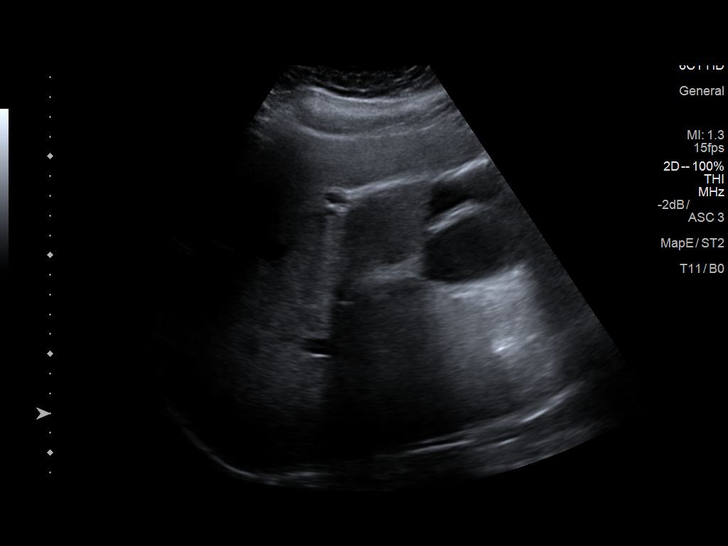
[im 26/101]
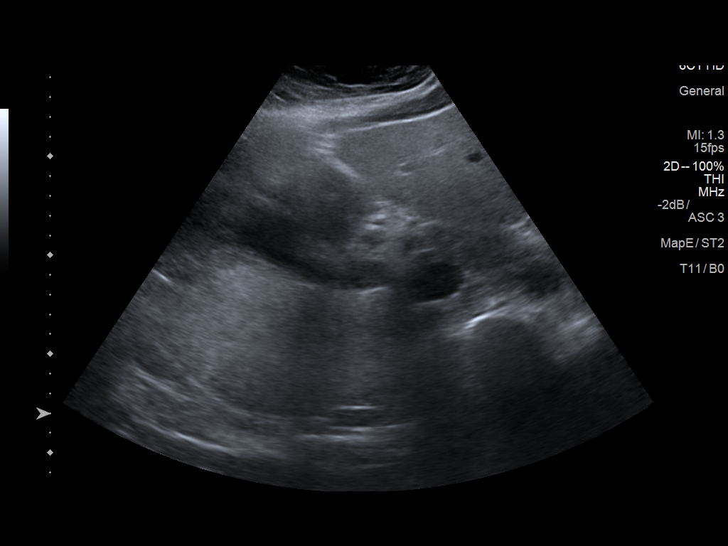
[im 34/101]
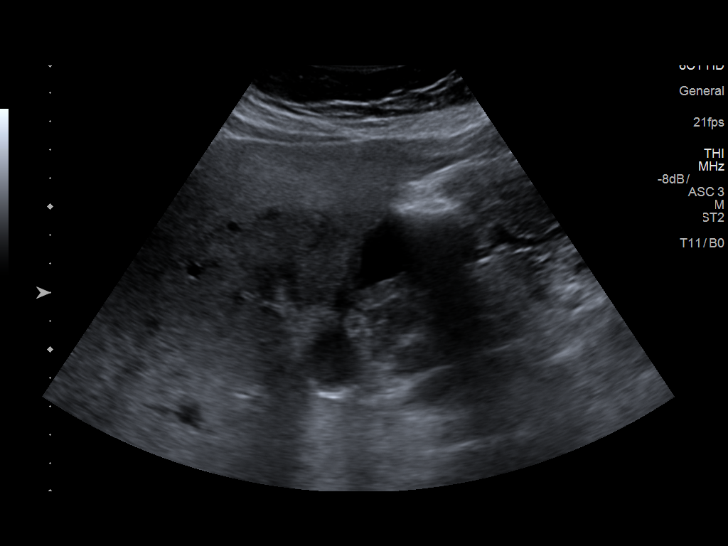
[im 42/101]
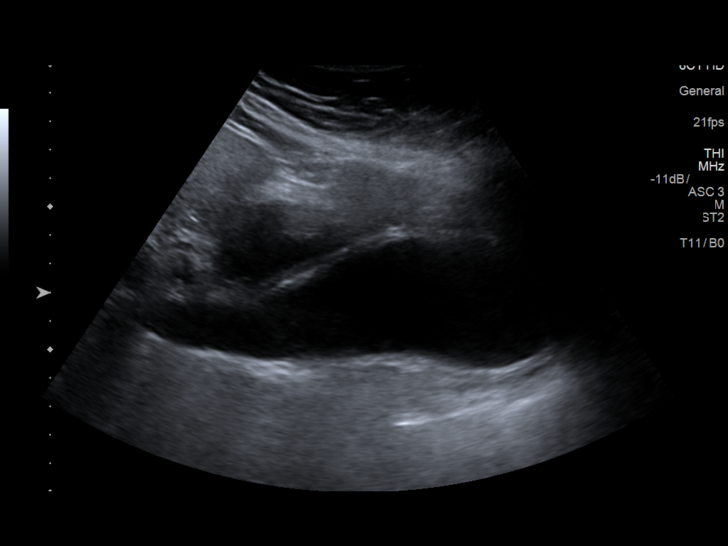
[im 51/101]
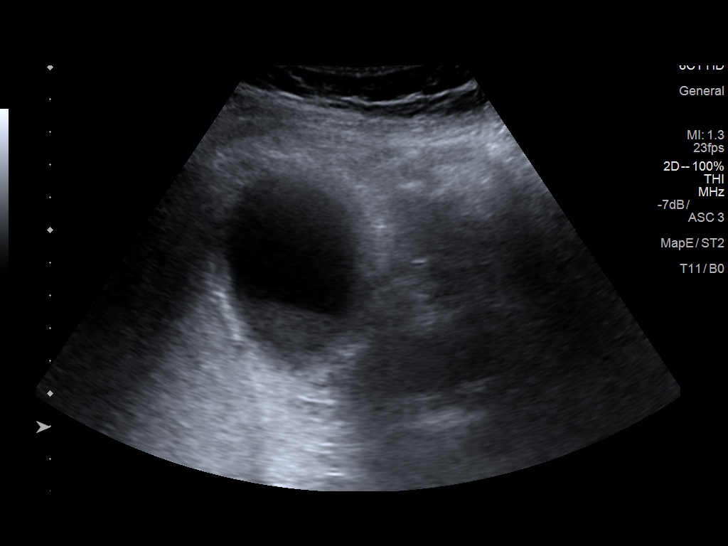
[im 59/101]
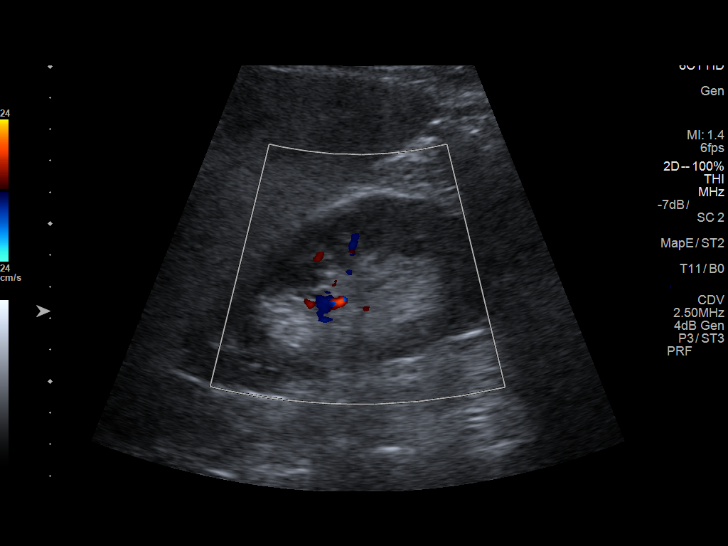
[im 67/101]
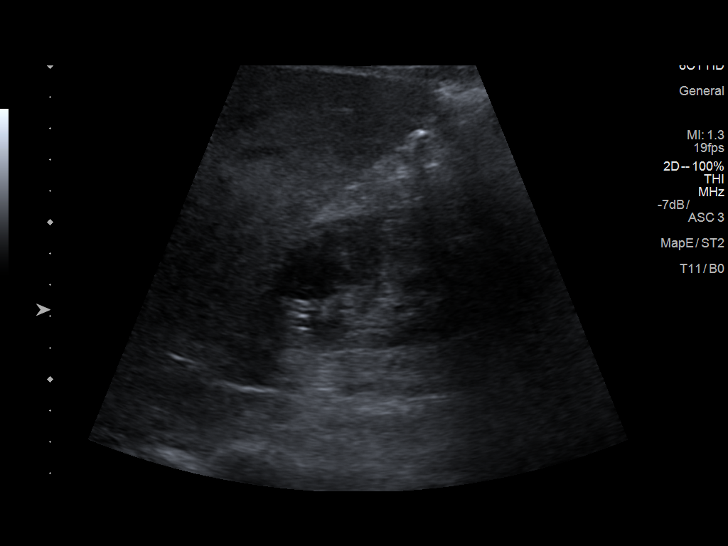
[im 76/101]
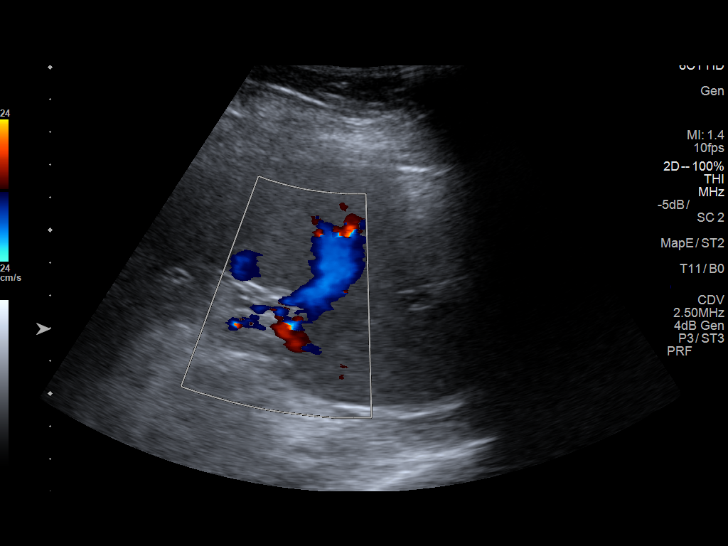
[im 84/101]
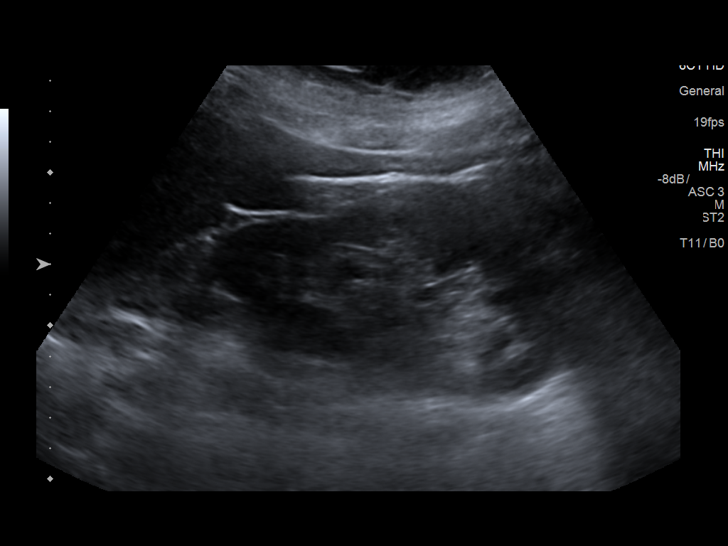
[im 92/101]
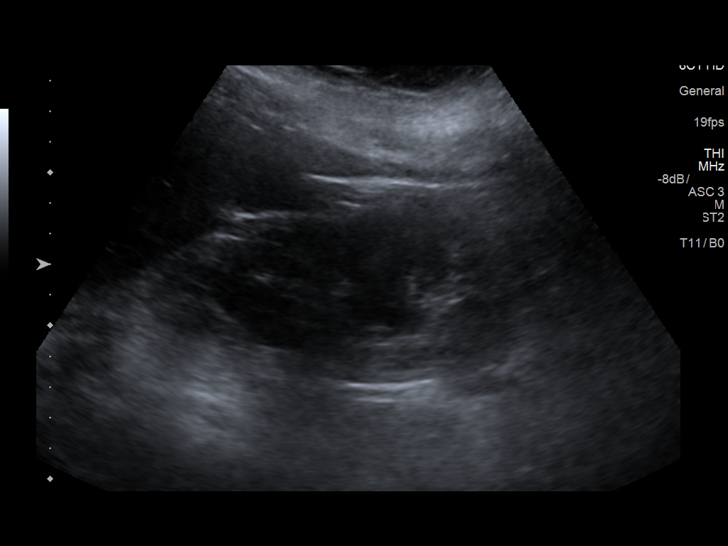
[im 101/101]
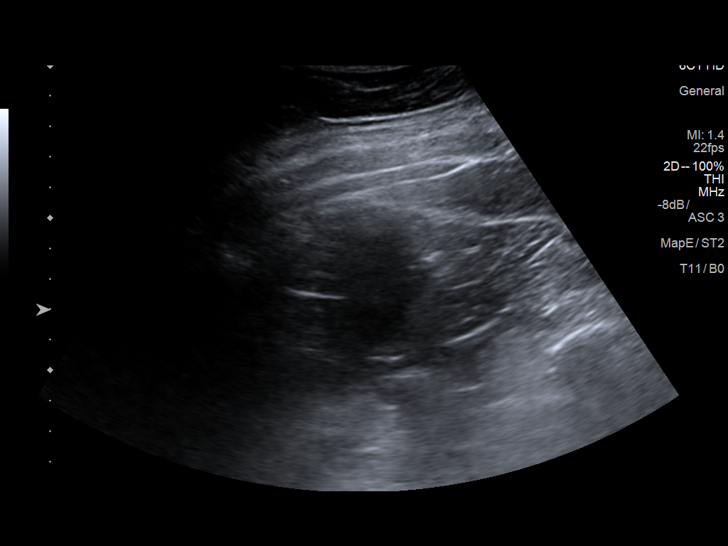

[13 of 25 positions shown; findings below may reference images not displayed]

FINDINGS: Gallbladder: No visible gallstones. There is sludge within the
gallbladder. There is gallbladder wall thickening, measuring 6 mm.
The patient was tender over the gallbladder during the study.

Common bile duct: Diameter: Normal caliber, 3 mm

Liver: No focal lesion identified. Within normal limits in
parenchymal echogenicity.

IVC: No abnormality visualized.

Pancreas: Visualized portion unremarkable.

Spleen: Size and appearance within normal limits.

Right Kidney: Length: 11.2 cm. Echogenicity within normal limits. No
mass or hydronephrosis visualized.

Left Kidney: Length: 11.6 cm. Echogenicity within normal limits. No
mass or hydronephrosis visualized.

Abdominal aorta: No aneurysm visualized.

Other findings: None.
IMPRESSION: No visible stones within the gallbladder. There is layering sludge,
wall thickening and tenderness over the gallbladder during the
study. Findings concerning for acute acalculous cholecystitis. This
could be further evaluated with nuclear medicine hepatobiliary scan
if felt indicated.

## 2017-06-27 IMAGING — US US ABDOMEN LIMITED
1 series · 14 of 25 positions shown · non-contrast
Comparison: CT dated 04/17/2016

CLINICAL DATA: 50-year-old female with right upper quadrant
abdominal pain. History of hepatitis-C.

EXAM:
US ABDOMEN LIMITED - RIGHT UPPER QUADRANT

[Series 1: us abdomen limited · 0.30mm/px · 14 of 39 slices shown]
[im 1/39]
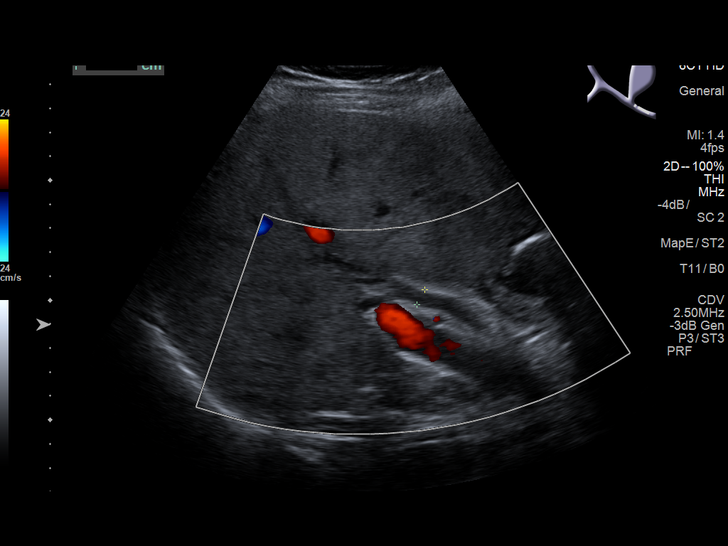
[im 4/39]
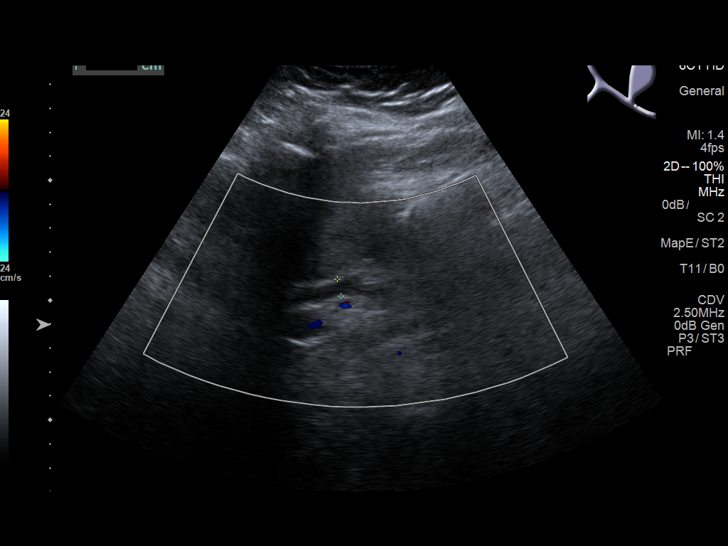
[im 7/39]
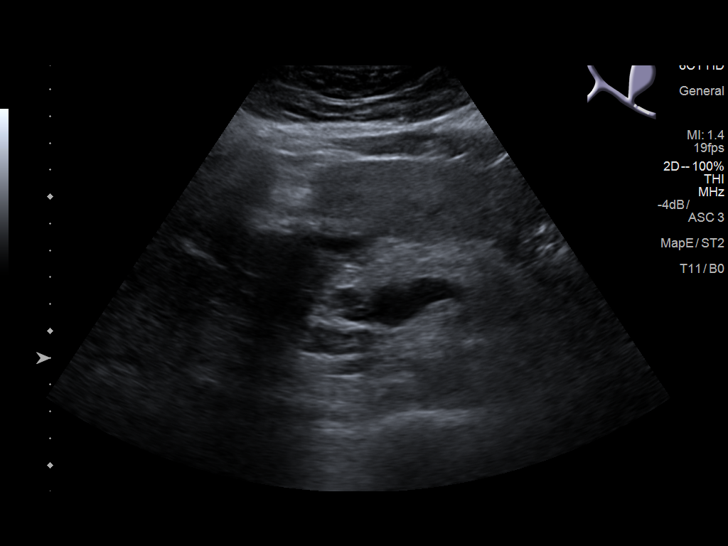
[im 10/39]
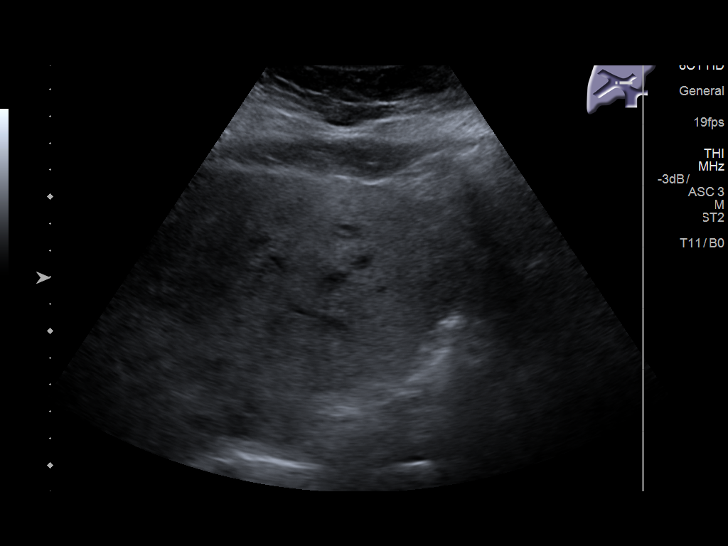
[im 13/39]
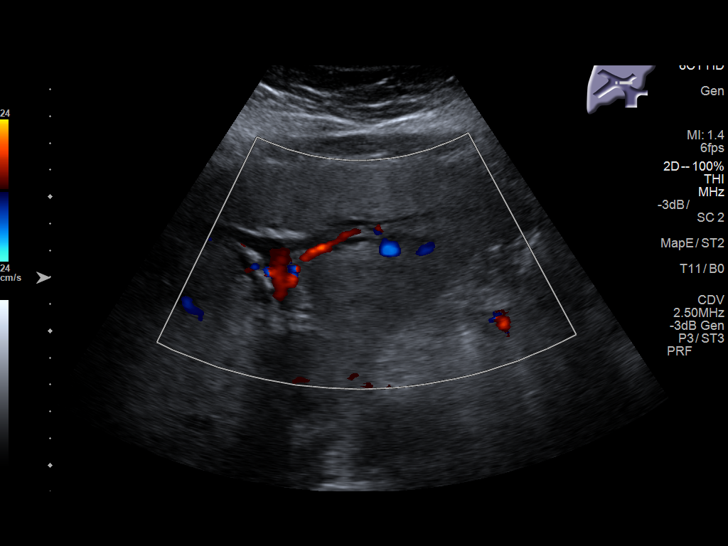
[im 15/39]
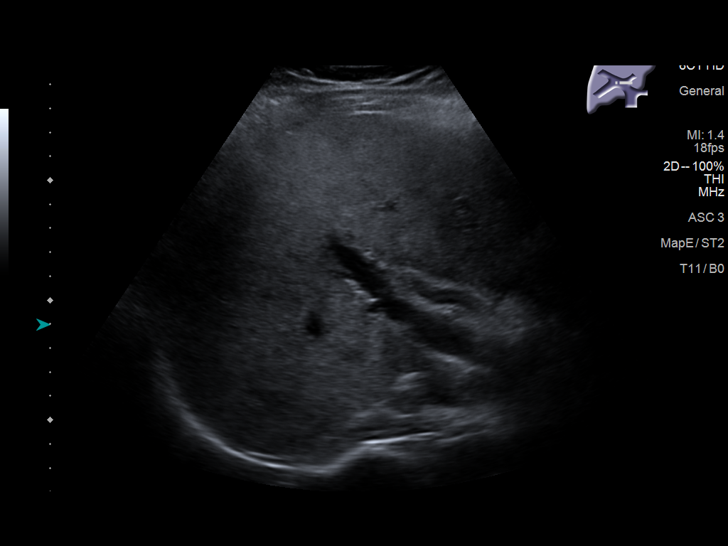
[im 18/39]
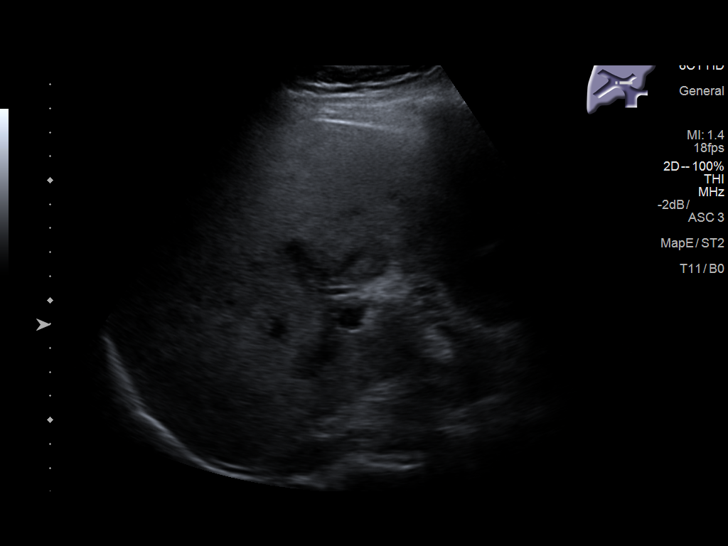
[im 21/39]
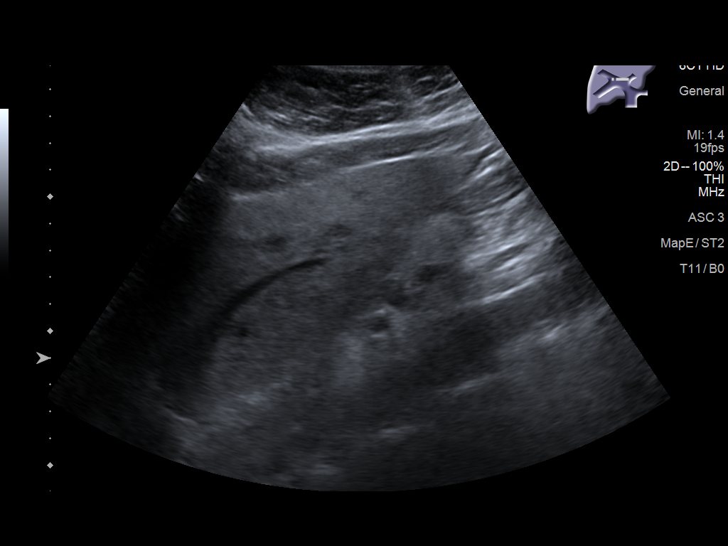
[im 24/39]
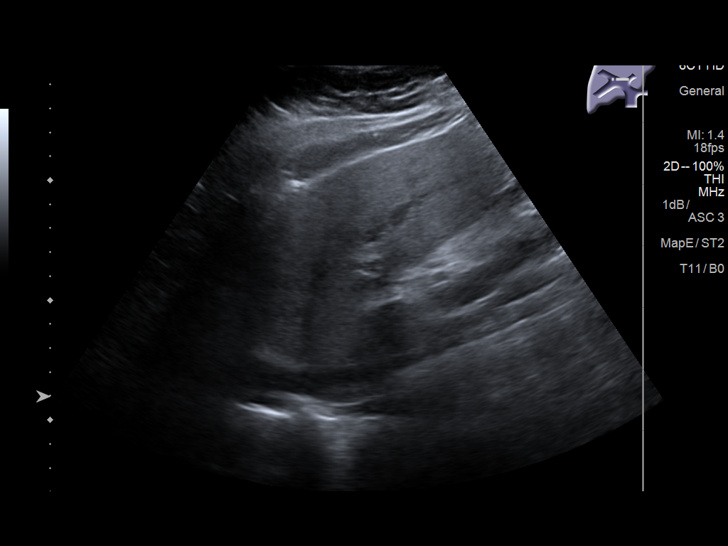
[im 26/39]
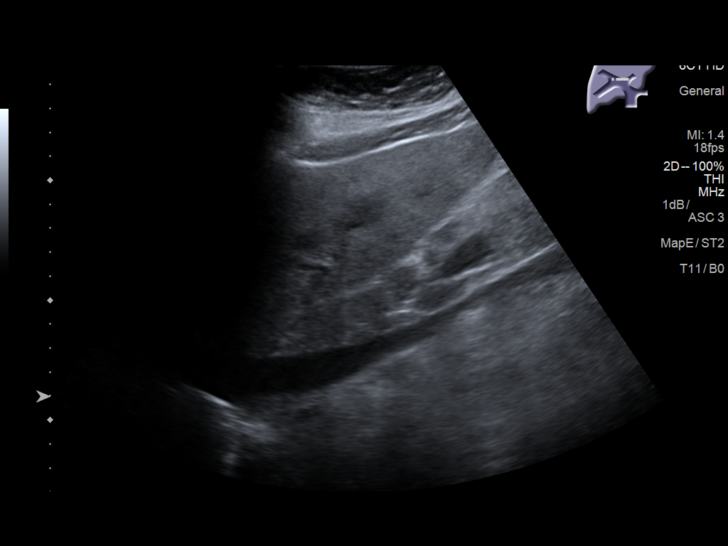
[im 29/39]
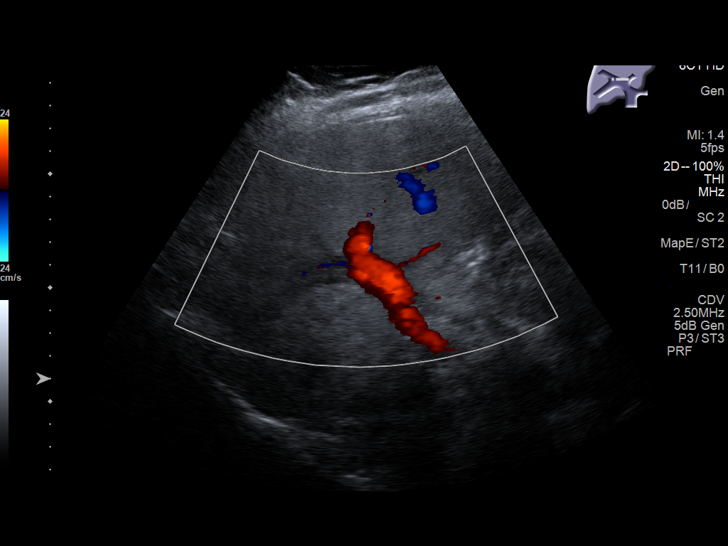
[im 32/39]
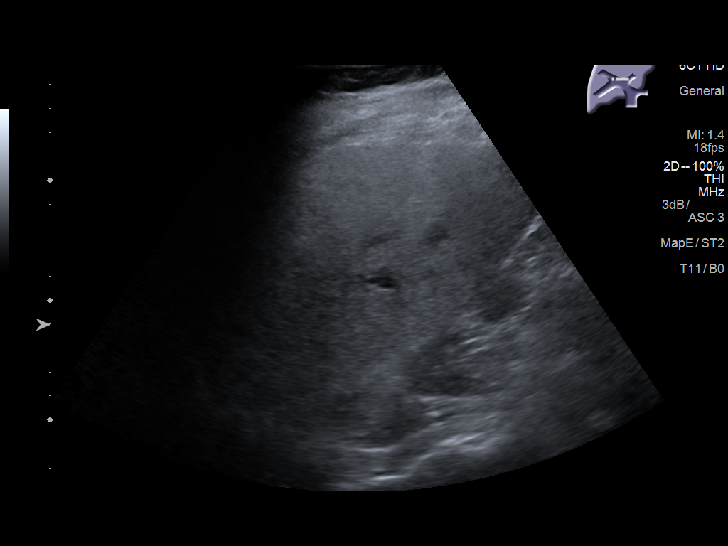
[im 35/39]
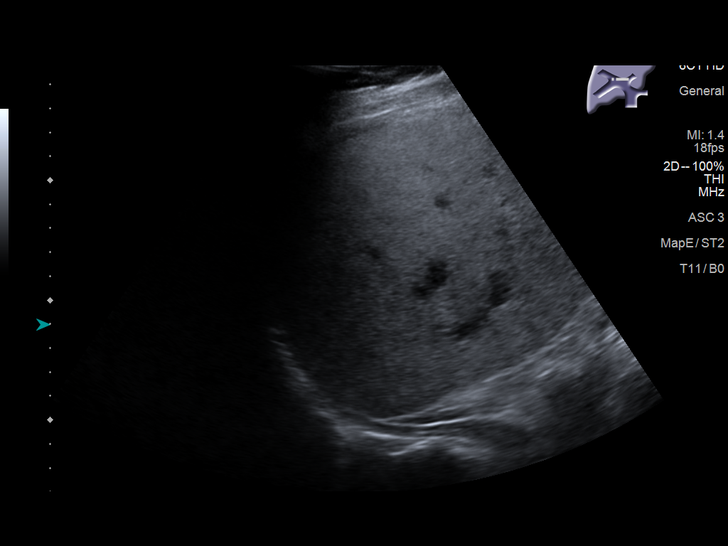
[im 39/39]
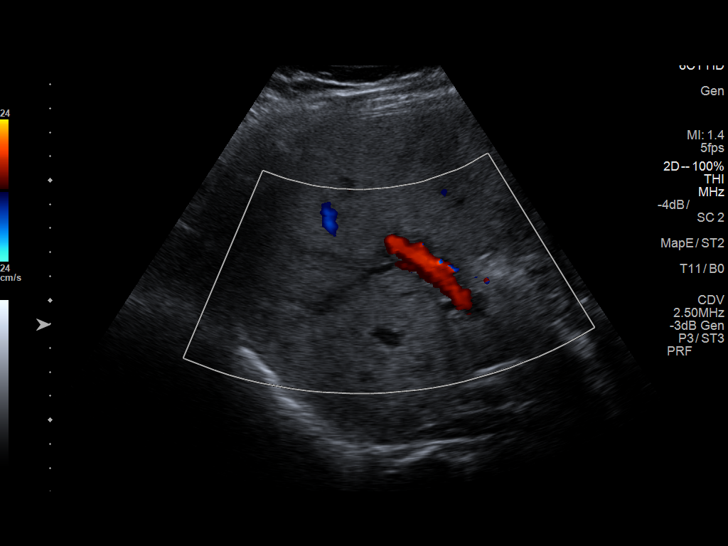

[14 of 25 positions shown; findings below may reference images not displayed]

FINDINGS: Gallbladder:

Cholecystectomy.

Common bile duct:

Diameter: 7 mm

Liver:

There is diffuse heterogeneous hepatic echotexture with increased
echogenicity, likely related to underlying fatty infiltration.
IMPRESSION: Fatty liver.

Cholecystectomy.
